# Patient Record
Sex: Female | Born: 1970 | Race: White | Hispanic: No | Marital: Single | Smoking: Never smoker
Health system: Southern US, Community
[De-identification: ages and names within clinical notes are randomized; demographics above are authoritative.]

## PROBLEM LIST (undated history)

## (undated) DIAGNOSIS — E119 Type 2 diabetes mellitus without complications: Secondary | ICD-10-CM

## (undated) DIAGNOSIS — C9100 Acute lymphoblastic leukemia not having achieved remission: Secondary | ICD-10-CM

## (undated) DIAGNOSIS — N289 Disorder of kidney and ureter, unspecified: Secondary | ICD-10-CM

## (undated) HISTORY — PX: OTHER SURGICAL HISTORY: SHX169

## (undated) HISTORY — PX: TONSILLECTOMY: SUR1361

---

## 2015-05-19 ENCOUNTER — Emergency Department (HOSPITAL_BASED_OUTPATIENT_CLINIC_OR_DEPARTMENT_OTHER): Payer: Managed Care, Other (non HMO)

## 2015-05-19 ENCOUNTER — Encounter (HOSPITAL_BASED_OUTPATIENT_CLINIC_OR_DEPARTMENT_OTHER): Payer: Self-pay

## 2015-05-19 ENCOUNTER — Emergency Department (HOSPITAL_BASED_OUTPATIENT_CLINIC_OR_DEPARTMENT_OTHER)
Admission: EM | Admit: 2015-05-19 | Discharge: 2015-05-20 | Disposition: A | Discharge status: Home / Self Care | Payer: Managed Care, Other (non HMO) | Attending: Emergency Medicine | Admitting: Emergency Medicine

## 2015-05-19 DIAGNOSIS — N390 Urinary tract infection, site not specified: Secondary | ICD-10-CM | POA: Diagnosis not present

## 2015-05-19 DIAGNOSIS — Z3202 Encounter for pregnancy test, result negative: Secondary | ICD-10-CM | POA: Insufficient documentation

## 2015-05-19 DIAGNOSIS — Z856 Personal history of leukemia: Secondary | ICD-10-CM | POA: Diagnosis not present

## 2015-05-19 DIAGNOSIS — Z794 Long term (current) use of insulin: Secondary | ICD-10-CM | POA: Diagnosis not present

## 2015-05-19 DIAGNOSIS — N23 Unspecified renal colic: Secondary | ICD-10-CM | POA: Diagnosis not present

## 2015-05-19 DIAGNOSIS — E119 Type 2 diabetes mellitus without complications: Secondary | ICD-10-CM | POA: Diagnosis not present

## 2015-05-19 DIAGNOSIS — R109 Unspecified abdominal pain: Secondary | ICD-10-CM | POA: Diagnosis present

## 2015-05-19 DIAGNOSIS — R10A2 Flank pain, left side: Secondary | ICD-10-CM

## 2015-05-19 HISTORY — DX: Type 2 diabetes mellitus without complications: E11.9

## 2015-05-19 HISTORY — DX: Acute lymphoblastic leukemia not having achieved remission: C91.00

## 2015-05-19 LAB — COMPREHENSIVE METABOLIC PANEL
ALBUMIN: 4 g/dL (ref 3.5–5.0)
ALT: 27 U/L (ref 14–54)
ANION GAP: 6 (ref 5–15)
AST: 35 U/L (ref 15–41)
Alkaline Phosphatase: 73 U/L (ref 38–126)
BUN: 25 mg/dL — ABNORMAL HIGH (ref 6–20)
CO2: 28 mmol/L (ref 22–32)
CREATININE: 1.48 mg/dL — AB (ref 0.44–1.00)
Calcium: 8.2 mg/dL — ABNORMAL LOW (ref 8.9–10.3)
Chloride: 100 mmol/L — ABNORMAL LOW (ref 101–111)
GFR calc Af Amer: 48 mL/min — ABNORMAL LOW (ref >60)
GFR, EST NON AFRICAN AMERICAN: 42 mL/min — AB (ref >60)
Glucose, Bld: 271 mg/dL — ABNORMAL HIGH (ref 65–99)
POTASSIUM: 4 mmol/L (ref 3.5–5.1)
SODIUM: 134 mmol/L — AB (ref 135–145)
TOTAL PROTEIN: 7 g/dL (ref 6.5–8.1)
Total Bilirubin: 0.4 mg/dL (ref 0.3–1.2)

## 2015-05-19 LAB — CBC WITH DIFFERENTIAL/PLATELET
BASOS PCT: 0 % (ref 0–1)
Basophils Absolute: 0 10*3/uL (ref 0.0–0.1)
EOS PCT: 1 % (ref 0–5)
Eosinophils Absolute: 0.1 10*3/uL (ref 0.0–0.7)
HCT: 40.1 % (ref 36.0–46.0)
Hemoglobin: 12.9 g/dL (ref 12.0–15.0)
Lymphocytes Relative: 33 % (ref 12–46)
Lymphs Abs: 3.4 10*3/uL (ref 0.7–4.0)
MCH: 28.5 pg (ref 26.0–34.0)
MCHC: 32.2 g/dL (ref 30.0–36.0)
MCV: 88.5 fL (ref 78.0–100.0)
Monocytes Absolute: 0.7 10*3/uL (ref 0.1–1.0)
Monocytes Relative: 6 % (ref 3–12)
NEUTROS PCT: 60 % (ref 43–77)
Neutro Abs: 6.1 10*3/uL (ref 1.7–7.7)
PLATELETS: 200 10*3/uL (ref 150–400)
RBC: 4.53 MIL/uL (ref 3.87–5.11)
RDW: 13.2 % (ref 11.5–15.5)
WBC: 10.4 10*3/uL (ref 4.0–10.5)

## 2015-05-19 LAB — URINE MICROSCOPIC-ADD ON

## 2015-05-19 LAB — URINALYSIS, ROUTINE W REFLEX MICROSCOPIC
BILIRUBIN URINE: NEGATIVE
Glucose, UA: 100 mg/dL — AB
KETONES UR: 15 mg/dL — AB
NITRITE: POSITIVE — AB
Protein, ur: NEGATIVE mg/dL
SPECIFIC GRAVITY, URINE: 1.021 (ref 1.005–1.030)
UROBILINOGEN UA: 1 mg/dL (ref 0.0–1.0)
pH: 5 (ref 5.0–8.0)

## 2015-05-19 LAB — PREGNANCY, URINE: Preg Test, Ur: NEGATIVE

## 2015-05-19 MED ORDER — ONDANSETRON HCL 4 MG/2ML IJ SOLN
4.0000 mg | Freq: Once | INTRAMUSCULAR | Status: AC
Start: 2015-05-19 — End: 2015-05-19
  Administered 2015-05-19: 4 mg via INTRAVENOUS
  Filled 2015-05-19: qty 2

## 2015-05-19 MED ORDER — SODIUM CHLORIDE 0.9 % IV SOLN
Freq: Once | INTRAVENOUS | Status: AC
  Administered 2015-05-19: 23:00:00 via INTRAVENOUS

## 2015-05-19 MED ORDER — FENTANYL CITRATE (PF) 100 MCG/2ML IJ SOLN
100.0000 ug | Freq: Once | INTRAMUSCULAR | Status: AC
  Administered 2015-05-19: 100 ug via INTRAVENOUS
  Filled 2015-05-19: qty 2

## 2015-05-19 MED ORDER — CEFTRIAXONE SODIUM 1 G IJ SOLR
INTRAMUSCULAR | Status: AC
  Filled 2015-05-19: qty 10

## 2015-05-19 MED ORDER — DEXTROSE 5 % IV SOLN
1.0000 g | Freq: Once | INTRAVENOUS | Status: AC
  Administered 2015-05-19: 1 g via INTRAVENOUS

## 2015-05-19 NOTE — ED Notes (Signed)
VSS, remains alert, NAD, calm, interactive, resps e/u, no longer shallow, O2 [place] 3L remains.

## 2015-05-19 NOTE — ED Notes (Signed)
After fentanyl pt became apneic and turned cyanotic, SPO2 down to 60s NRB placed at 15L, pt arousable to sternal rub and follows commands to deep breathe, SPO2 improved to 100%, skin returned to pink, A&Ox4, Dr. Florina Ou immediately into room, pt alert, NAD, "scared" interactive, follows commands.

## 2015-05-19 NOTE — ED Notes (Signed)
Pt reports left flank pain since 0200 - states seen at Urgent Care and given 2 prescriptions for UTI that are not effective.

## 2015-05-19 NOTE — ED Notes (Signed)
Pt alert, NAD, calm, interactive, "feel better", to xray on stretcher and O2.

## 2015-05-19 NOTE — ED Provider Notes (Addendum)
CSN: [id number]     Arrival date & time 05/19/15  2208 History  This chart was scribed for Shanon Rosser, MD by Chester Holstein, ED Scribe. This patient was seen in room MH12/MH12 and the patient's care was started at 11:04 PM.    Chief Complaint  Patient presents with  . Flank Pain   The history is provided by the patient. No language interpreter was used.     HPI HPI Comments: Vicki Francis is a 44 y.o. female who presents to the Emergency Department complaining of left flank pain with onset at 2 AM today. She states pain radiates to LLQ of abdomen. She describes the pain as severe. It is not significantly changed with movement. She reports associated single episode of vomiting and urinary frequency. Pt was seen at an Urgent Care earlier and dx with a UTI. Pt was started on ciprofloxacin and pyridium. Pt denies fever, chills and dysuria.  Past Medical History  Diagnosis Date  . Diabetes mellitus without complication   . ALL (acute lymphoblastic leukemia)    Past Surgical History  Procedure Laterality Date  . Tonsillectomy     History reviewed. No pertinent family history. History  Substance Use Topics  . Smoking status: Never Smoker   . Smokeless tobacco: Not on file  . Alcohol Use: No   OB History    No data available     Review of Systems A complete 10 system review of systems was obtained and all systems are negative except as noted in the HPI and PMH.      Allergies  Fentanyl; Aleve; and Codeine  Home Medications   Prior to Admission medications   Medication Sig Start Date End Date Taking? Authorizing Provider  Insulin Aspart (NOVOLOG [place]) Inject into the skin.   Yes Historical Provider, MD   BP 146/93 mmHg  Pulse 75  Temp(Src) 97.8 F (36.6 C) (Oral)  Resp 26  Ht 5\' 1"  (1.549 m)  Wt 244 lb (110.678 kg)  BMI 46.13 kg/m2  SpO2 96%   Physical Exam  General: Well-developed, well-nourished female in no acute distress; appearance consistent with age of  record HENT: normocephalic; atraumatic Eyes: pupils equal, round and reactive to light; extraocular muscles intact Neck: supple Heart: regular rate and rhythm; no murmurs, rubs or gallops Lungs: clear to auscultation bilaterally Abdomen: soft; nondistended; mild left suprapubic tenderness; no masses or hepatosplenomegaly; bowel sounds present GU: mild left CVA tenderness Extremities: No deformity; full range of motion; pulses normal Neurologic: Awake, alert and oriented; motor function intact in all extremities and symmetric; no facial droop Skin: Warm and dry Psychiatric: Normal mood and affect Nursing note and vitals reviewed.   ED Course  Procedures (including critical care time)   COORDINATION OF CARE: 11:08 PM Discussed treatment plan with patient at beside, the patient agrees with the plan and has no further questions at this time.     MDM   Nursing notes and vitals signs, including pulse oximetry, reviewed.  Summary of this visit's results, reviewed by myself:  Labs:  Results for orders placed or performed during the hospital encounter of 05/19/15 (from the past 24 hour(s))  Urinalysis, Routine w reflex microscopic (not at [facility])     Status: Abnormal   Collection Time: 05/19/15 10:17 PM  Result Value Ref Range   Color, Urine ORANGE (A) YELLOW   APPearance CLOUDY (A) CLEAR   Specific Gravity, Urine 1.021 1.005 - 1.030   pH 5.0 5.0 - 8.0   Glucose, UA  100 (A) NEGATIVE mg/dL   Hgb urine dipstick TRACE (A) NEGATIVE   Bilirubin Urine NEGATIVE NEGATIVE   Ketones, ur 15 (A) NEGATIVE mg/dL   Protein, ur NEGATIVE NEGATIVE mg/dL   Urobilinogen, UA 1.0 0.0 - 1.0 mg/dL   Nitrite POSITIVE (A) NEGATIVE   Leukocytes, UA LARGE (A) NEGATIVE  Urine microscopic-add on     Status: Abnormal   Collection Time: 05/19/15 10:17 PM  Result Value Ref Range   Squamous Epithelial / LPF FEW (A) RARE   WBC, UA 21-50 <3 WBC/hpf   RBC / HPF 0-2 <3 RBC/hpf   Bacteria, UA MANY (A) RARE   Pregnancy, urine     Status: None   Collection Time: 05/19/15 10:17 PM  Result Value Ref Range   Preg Test, Ur NEGATIVE NEGATIVE  CBC with Differential     Status: None   Collection Time: 05/19/15 10:42 PM  Result Value Ref Range   WBC 10.4 4.0 - 10.5 K/uL   RBC 4.53 3.87 - 5.11 MIL/uL   Hemoglobin 12.9 12.0 - 15.0 g/dL   HCT 40.1 36.0 - 46.0 %   MCV 88.5 78.0 - 100.0 fL   MCH 28.5 26.0 - 34.0 pg   MCHC 32.2 30.0 - 36.0 g/dL   RDW 13.2 11.5 - 15.5 %   Platelets 200 150 - 400 K/uL   Neutrophils Relative % 60 43 - 77 %   Neutro Abs 6.1 1.7 - 7.7 K/uL   Lymphocytes Relative 33 12 - 46 %   Lymphs Abs 3.4 0.7 - 4.0 K/uL   Monocytes Relative 6 3 - 12 %   Monocytes Absolute 0.7 0.1 - 1.0 K/uL   Eosinophils Relative 1 0 - 5 %   Eosinophils Absolute 0.1 0.0 - 0.7 K/uL   Basophils Relative 0 0 - 1 %   Basophils Absolute 0.0 0.0 - 0.1 K/uL  Comprehensive metabolic panel     Status: Abnormal   Collection Time: 05/19/15 10:45 PM  Result Value Ref Range   Sodium 134 (L) 135 - 145 mmol/L   Potassium 4.0 3.5 - 5.1 mmol/L   Chloride 100 (L) 101 - 111 mmol/L   CO2 28 22 - 32 mmol/L   Glucose, Bld 271 (H) 65 - 99 mg/dL   BUN 25 (H) 6 - 20 mg/dL   Creatinine, Ser 1.48 (H) 0.44 - 1.00 mg/dL   Calcium 8.2 (L) 8.9 - 10.3 mg/dL   Total Protein 7.0 6.5 - 8.1 g/dL   Albumin 4.0 3.5 - 5.0 g/dL   AST 35 15 - 41 U/L   ALT 27 14 - 54 U/L   Alkaline Phosphatase 73 38 - 126 U/L   Total Bilirubin 0.4 0.3 - 1.2 mg/dL   GFR calc non Af Amer 42 (L) >60 mL/min   GFR calc Af Amer 48 (L) >60 mL/min   Anion gap 6 5 - 15    Imaging Studies: Ct Renal Stone Study  05/20/2015   CLINICAL DATA:  Acute onset of left flank pain. Vomiting and increased urinary frequency. Initial encounter.  EXAM: CT ABDOMEN AND PELVIS WITHOUT CONTRAST  TECHNIQUE: Multidetector CT imaging of the abdomen and pelvis was performed following the standard protocol without IV contrast.  COMPARISON:  None.  FINDINGS: The visualized lung  bases are clear.  There is diffuse fatty infiltration within the liver. The spleen is unremarkable in appearance. The gallbladder is within normal limits. The pancreas and adrenal glands are unremarkable.  There is minimal left-sided hydronephrosis, with  mild left-sided perinephric stranding. An obstructing 6 x 4 mm stone is noted in the proximal left ureter, 2-3 cm below the left renal pelvis. Bilateral fetal lobulations are seen. A nonobstructing 4 mm stone is noted near the upper pole of the right kidney.  No free fluid is identified. The small bowel is unremarkable in appearance. The stomach is within normal limits. No acute vascular abnormalities are seen.  The appendix is normal in caliber, without evidence for appendicitis. The colon is unremarkable in appearance.  The bladder is mildly distended and grossly unremarkable. The uterus is within normal limits. The ovaries are relatively symmetric. No suspicious adnexal masses are seen. No inguinal lymphadenopathy is seen.  No acute osseous abnormalities are identified. 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  IMPRESSION: 1. Minimal left-sided hydronephrosis, with an obstructing 6 x 4 mm stone in the proximal left ureter, 2-3 cm below the left renal pelvis. 2. Nonobstructing 4 mm stone near the upper pole of the right kidney. 3. Diffuse fatty infiltration within the liver. 4. 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   Electronically Signed   By: Garald Balding M.D.   On: 05/20/2015 01:23    11:33 PM Patient became apneic after 100 micrograms of fentanyl was administered over 6 minutes. Oxygen saturations improved with exogenous oxygen. Patient now awake and able to converse. For list fentanyl as a high risk drug intolerance.  2:18 AM Pain improved with IV morphine which the patient is tolerated well. She states he is ready to go home at this time. Her care was discussed with Dr. Junious Silk of urology. He will see her in the office in follow-up. He  advises anti-biotic treatment in the meantime. Patient states she can tolerate oxycodone.  I personally performed the services described in this documentation, which was scribed in my presence. The recorded information has been reviewed and is accurate.    Shanon Rosser, MD 05/20/15 [place], MD 05/20/15 [phone]

## 2015-05-20 LAB — URINE CULTURE
COLONY COUNT: NO GROWTH
Culture: NO GROWTH

## 2015-05-20 MED ORDER — MORPHINE SULFATE 2 MG/ML IJ SOLN
2.0000 mg | Freq: Once | INTRAMUSCULAR | Status: AC
  Administered 2015-05-20: 2 mg via INTRAVENOUS
  Filled 2015-05-20: qty 1

## 2015-05-20 MED ORDER — TAMSULOSIN HCL 0.4 MG PO CAPS
0.4000 mg | ORAL_CAPSULE | Freq: Once | ORAL | Status: AC
  Administered 2015-05-20: 0.4 mg via ORAL
  Filled 2015-05-20: qty 1

## 2015-05-20 MED ORDER — ONDANSETRON HCL 4 MG/2ML IJ SOLN
INTRAMUSCULAR | Status: AC
  Administered 2015-05-20: 4 mg via INTRAVENOUS
  Filled 2015-05-20: qty 2

## 2015-05-20 MED ORDER — OXYCODONE-ACETAMINOPHEN 5-325 MG PO TABS: 1.0000 | ORAL_TABLET | Freq: Four times a day (QID) | ORAL | Status: AC | PRN

## 2015-05-20 MED ORDER — METOCLOPRAMIDE HCL 10 MG PO TABS
10.0000 mg | ORAL_TABLET | Freq: Four times a day (QID) | ORAL | Status: AC | PRN
Start: 2015-05-20 — End: ?

## 2015-05-20 MED ORDER — MORPHINE SULFATE 4 MG/ML IJ SOLN
4.0000 mg | Freq: Once | INTRAMUSCULAR | Status: AC
  Administered 2015-05-20: 4 mg via INTRAVENOUS
  Filled 2015-05-20: qty 1

## 2015-05-20 MED ORDER — CIPROFLOXACIN HCL 500 MG PO TABS: 500.0000 mg | ORAL_TABLET | Freq: Two times a day (BID) | ORAL | Status: AC

## 2015-05-20 MED ORDER — TAMSULOSIN HCL 0.4 MG PO CAPS: ORAL_CAPSULE | ORAL | Status: AC

## 2015-05-20 MED ORDER — ONDANSETRON HCL 4 MG/2ML IJ SOLN
4.0000 mg | Freq: Once | INTRAMUSCULAR | Status: AC
  Administered 2015-05-20: 4 mg via INTRAVENOUS

## 2015-05-20 NOTE — ED Notes (Signed)
"  feel better", given Rx x3, pain severe 8-9/10, "eased off", "ready to go home", nausea resolved, friend at [facility]. Denies questions or needs. Given referral.

## 2015-05-20 NOTE — ED Notes (Signed)
Dr. Florina Ou at [facility], family at [facility]. Pt resting, NAD, calm, interactive, no dyspnea noted, VSS.

## 2015-07-11 ENCOUNTER — Encounter (HOSPITAL_BASED_OUTPATIENT_CLINIC_OR_DEPARTMENT_OTHER): Payer: Self-pay | Admitting: *Deleted

## 2015-07-11 ENCOUNTER — Emergency Department (HOSPITAL_BASED_OUTPATIENT_CLINIC_OR_DEPARTMENT_OTHER)
Admission: EM | Admit: 2015-07-11 | Discharge: 2015-07-11 | Disposition: A | Discharge status: Home / Self Care | Payer: Managed Care, Other (non HMO) | Attending: Emergency Medicine | Admitting: Emergency Medicine

## 2015-07-11 ENCOUNTER — Emergency Department (HOSPITAL_BASED_OUTPATIENT_CLINIC_OR_DEPARTMENT_OTHER): Payer: Managed Care, Other (non HMO)

## 2015-07-11 DIAGNOSIS — R109 Unspecified abdominal pain: Secondary | ICD-10-CM | POA: Diagnosis present

## 2015-07-11 DIAGNOSIS — Z856 Personal history of leukemia: Secondary | ICD-10-CM | POA: Diagnosis not present

## 2015-07-11 DIAGNOSIS — E119 Type 2 diabetes mellitus without complications: Secondary | ICD-10-CM | POA: Diagnosis not present

## 2015-07-11 DIAGNOSIS — Z794 Long term (current) use of insulin: Secondary | ICD-10-CM | POA: Insufficient documentation

## 2015-07-11 DIAGNOSIS — K5000 Crohn's disease of small intestine without complications: Secondary | ICD-10-CM | POA: Diagnosis not present

## 2015-07-11 DIAGNOSIS — R319 Hematuria, unspecified: Secondary | ICD-10-CM

## 2015-07-11 DIAGNOSIS — N390 Urinary tract infection, site not specified: Secondary | ICD-10-CM | POA: Diagnosis not present

## 2015-07-11 DIAGNOSIS — Z3202 Encounter for pregnancy test, result negative: Secondary | ICD-10-CM | POA: Insufficient documentation

## 2015-07-11 HISTORY — DX: Disorder of kidney and ureter, unspecified: N28.9

## 2015-07-11 LAB — CBC
HEMATOCRIT: 42.3 % (ref 36.0–46.0)
Hemoglobin: 13.7 g/dL (ref 12.0–15.0)
MCH: 28.2 pg (ref 26.0–34.0)
MCHC: 32.4 g/dL (ref 30.0–36.0)
MCV: 87.2 fL (ref 78.0–100.0)
PLATELETS: 261 10*3/uL (ref 150–400)
RBC: 4.85 MIL/uL (ref 3.87–5.11)
RDW: 14.2 % (ref 11.5–15.5)
WBC: 9.2 10*3/uL (ref 4.0–10.5)

## 2015-07-11 LAB — URINALYSIS, ROUTINE W REFLEX MICROSCOPIC
Bilirubin Urine: NEGATIVE
Glucose, UA: 100 mg/dL — AB
Ketones, ur: NEGATIVE mg/dL
Nitrite: NEGATIVE
Protein, ur: NEGATIVE mg/dL
Specific Gravity, Urine: 1.014 (ref 1.005–1.030)
Urobilinogen, UA: 0.2 mg/dL (ref 0.0–1.0)
pH: 5.5 (ref 5.0–8.0)

## 2015-07-11 LAB — BASIC METABOLIC PANEL
ANION GAP: 11 (ref 5–15)
BUN: 22 mg/dL — ABNORMAL HIGH (ref 6–20)
CALCIUM: 9.9 mg/dL (ref 8.9–10.3)
CO2: 30 mmol/L (ref 22–32)
Chloride: 98 mmol/L — ABNORMAL LOW (ref 101–111)
Creatinine, Ser: 1.04 mg/dL — ABNORMAL HIGH (ref 0.44–1.00)
Glucose, Bld: 266 mg/dL — ABNORMAL HIGH (ref 65–99)
POTASSIUM: 4.3 mmol/L (ref 3.5–5.1)
Sodium: 139 mmol/L (ref 135–145)

## 2015-07-11 LAB — URINE MICROSCOPIC-ADD ON

## 2015-07-11 LAB — PREGNANCY, URINE: Preg Test, Ur: NEGATIVE

## 2015-07-11 MED ORDER — MORPHINE SULFATE 4 MG/ML IJ SOLN
4.0000 mg | Freq: Once | INTRAMUSCULAR | Status: AC
  Administered 2015-07-11: 4 mg via INTRAVENOUS
  Filled 2015-07-11: qty 1

## 2015-07-11 MED ORDER — CEPHALEXIN 500 MG PO CAPS: 500.0000 mg | ORAL_CAPSULE | Freq: Four times a day (QID) | ORAL | Status: AC

## 2015-07-11 MED ORDER — HYDROCODONE-ACETAMINOPHEN 5-325 MG PO TABS: 1.0000 | ORAL_TABLET | ORAL | Status: AC | PRN

## 2015-07-11 NOTE — ED Notes (Signed)
Patient transported to CT 

## 2015-07-11 NOTE — Discharge Instructions (Signed)
Take Vicodin for severe pain only. No driving or operating heavy machinery while taking vicodin. This medication may cause drowsiness. Take keflex as directed for 1 week. Return to the emergency department if you develop fever, diarrhea, or any worsening symptoms.  Urinary Tract Infection Urinary tract infections (UTIs) can develop anywhere along your urinary tract. Your urinary tract is your body's drainage system for removing wastes and extra water. Your urinary tract includes two kidneys, two ureters, a bladder, and a urethra. Your kidneys are a pair of bean-shaped organs. Each kidney is about the size of your fist. They are located below your ribs, one on each side of your spine. CAUSES Infections are caused by microbes, which are microscopic organisms, including fungi, viruses, and bacteria. These organisms are so small that they can only be seen through a microscope. Bacteria are the microbes that most commonly cause UTIs. SYMPTOMS  Symptoms of UTIs may vary by age and gender of the patient and by the location of the infection. Symptoms in young women typically include a frequent and intense urge to urinate and a painful, burning feeling in the bladder or urethra during urination. Older women and men are more likely to be tired, shaky, and weak and have muscle aches and abdominal pain. A fever may mean the infection is in your kidneys. Other symptoms of a kidney infection include pain in your back or sides below the ribs, nausea, and vomiting. DIAGNOSIS To diagnose a UTI, your caregiver will ask you about your symptoms. Your caregiver also will ask to provide a urine sample. The urine sample will be tested for bacteria and white blood cells. White blood cells are made by your body to help fight infection. TREATMENT  Typically, UTIs can be treated with medication. Because most UTIs are caused by a bacterial infection, they usually can be treated with the use of antibiotics. The choice of antibiotic  and length of treatment depend on your symptoms and the type of bacteria causing your infection. HOME CARE INSTRUCTIONS  If you were prescribed antibiotics, take them exactly as your caregiver instructs you. Finish the medication even if you feel better after you have only taken some of the medication.  Drink enough water and fluids to keep your urine clear or pale yellow.  Avoid caffeine, tea, and carbonated beverages. They tend to irritate your bladder.  Empty your bladder often. Avoid holding urine for long periods of time.  Empty your bladder before and after sexual intercourse.  After a bowel movement, women should cleanse from front to back. Use each tissue only once. SEEK MEDICAL CARE IF:   You have back pain.  You develop a fever.  Your symptoms do not begin to resolve within 3 days. SEEK IMMEDIATE MEDICAL CARE IF:   You have severe back pain or lower abdominal pain.  You develop chills.  You have nausea or vomiting.  You have continued burning or discomfort with urination. MAKE SURE YOU:   Understand these instructions.  Will watch your condition.  Will get help right away if you are not doing well or get worse. Document Released: 09/03/2005 Document Revised: 05/25/2012 Document Reviewed: 01/02/2012 [facility] Patient Information 2015 [place], [place]. This information is not intended to replace advice given to you by your health care provider. Make sure you discuss any questions you have with your health care provider. Flank Pain Flank pain refers to pain that is located on the side of the body between the upper abdomen and the back. The pain  may occur over a short period of time (acute) or may be long-term or reoccurring (chronic). It may be mild or severe. Flank pain can be caused by many things. CAUSES  Some of the more common causes of flank pain include:  Muscle strains.   Muscle spasms.   A disease of your spine (vertebral disk disease).   A lung  infection (pneumonia).   Fluid around your lungs (pulmonary edema).   A kidney infection.   Kidney stones.   A very painful skin rash caused by the chickenpox virus (shingles).   Gallbladder disease.  [place] care will depend on the cause of your pain. In general,  Rest as directed by your caregiver.  Drink enough fluids to keep your urine clear or pale yellow.  Only take over-the-counter or prescription medicines as directed by your caregiver. Some medicines may help relieve the pain.  Tell your caregiver about any changes in your pain.  Follow up with your caregiver as directed. SEEK IMMEDIATE MEDICAL CARE IF:   Your pain is not controlled with medicine.   You have new or worsening symptoms.  Your pain increases.   You have abdominal pain.   You have shortness of breath.   You have persistent nausea or vomiting.   You have swelling in your abdomen.   You feel faint or pass out.   You have blood in your urine.  You have a fever or persistent symptoms for more than 2-3 days.  You have a fever and your symptoms suddenly get worse. MAKE SURE YOU:   Understand these instructions.  Will watch your condition.  Will get help right away if you are not doing well or get worse. Document Released: 01/15/2006 Document Revised: 08/18/2012 Document Reviewed: 07/08/2012 [facility] Patient Information 2015 [place], [place]. This information is not intended to replace advice given to you by your health care provider. Make sure you discuss any questions you have with your health care provider.

## 2015-07-11 NOTE — ED Provider Notes (Signed)
CSN: [id number]     Arrival date & time 07/11/15  1849 History   First MD Initiated Contact with Patient 07/11/15 1859     Chief Complaint  Patient presents with  . Flank Pain     (Consider location/radiation/quality/duration/timing/severity/associated sxs/prior Treatment) HPI Comments: 44 year old morbidly obese female presenting with sudden onset left flank pain beginning around 4 PM today. Pain starts in her left lower back and radiates along her left flank, described as gnawing, constant, 8/10. States this feels very similar to when she had a kidney stone back in June that required stent placement after removal. She had the stent removed on July 5, followed by an infection of the stent site and was placed on antibiotics for 7 days. No problems since. Saw urology in [facility]. Denies any urinary symptoms. Denies increased urinary frequency, urgency, dysuria, hematuria, fever, chills, nausea or vomiting.  The history is provided by the patient.    Past Medical History  Diagnosis Date  . Diabetes mellitus without complication   . ALL (acute lymphoblastic leukemia)   . Renal disorder    Past Surgical History  Procedure Laterality Date  . Tonsillectomy    . Lithotripsy     No family history on file. History  Substance Use Topics  . Smoking status: Never Smoker   . Smokeless tobacco: Not on file  . Alcohol Use: No   OB History    No data available     Review of Systems  Genitourinary: Positive for flank pain.  Musculoskeletal: Positive for back pain.  All other systems reviewed and are negative.     Allergies  Fentanyl; Aleve; and Codeine  Home Medications   Prior to Admission medications   Medication Sig Start Date End Date Taking? Authorizing Provider  cephALEXin (KEFLEX) 500 MG capsule Take 1 capsule (500 mg total) by mouth 4 (four) times daily. 07/11/15   Carman Ching, PA-C  ciprofloxacin (CIPRO) 500 MG tablet Take 1 tablet (500 mg total) by mouth 2 (two) times  daily. One po bid x 7 days 05/20/15   Shanon Rosser, MD  HYDROcodone-acetaminophen (NORCO/VICODIN) 5-325 MG per tablet Take 1-2 tablets by mouth every 4 (four) hours as needed. 07/11/15   Carman Ching, PA-C  Insulin Aspart (NOVOLOG [place]) Inject into the skin.    Historical Provider, MD  metoCLOPramide (REGLAN) 10 MG tablet Take 1 tablet (10 mg total) by mouth every 6 (six) hours as needed for nausea or vomiting. 05/20/15   Shanon Rosser, MD  oxyCODONE-acetaminophen (PERCOCET) 5-325 MG per tablet Take 1-2 tablets by mouth every 6 (six) hours as needed (for pain). 05/20/15   John Molpus, MD  tamsulosin (FLOMAX) 0.4 MG CAPS capsule Take 1 capsule daily until stone passes. 05/20/15   John Molpus, MD   BP 147/102 mmHg  Pulse 86  Temp(Src) 98.3 F (36.8 C) (Oral)  Resp 18  Ht 5\' 1"  (1.549 m)  Wt 242 lb (109.77 kg)  BMI 45.75 kg/m2  SpO2 100% Physical Exam  Constitutional: She is oriented to person, place, and time. She appears well-developed and well-nourished. No distress.  Morbidly obese.  HENT:  Head: Normocephalic and atraumatic.  Mouth/Throat: Oropharynx is clear and moist.  Eyes: Conjunctivae and EOM are normal.  Neck: Normal range of motion. Neck supple.  Cardiovascular: Normal rate, regular rhythm and normal heart sounds.   Pulmonary/Chest: Effort normal and breath sounds normal. No respiratory distress.  Abdominal: There is no rigidity, no rebound and no guarding.  Exam limited by body habitus. No peritoneal signs.  Musculoskeletal: Normal range of motion. She exhibits no edema.       Back:  Neurological: She is alert and oriented to person, place, and time. No sensory deficit.  Skin: Skin is warm and dry.  Psychiatric: She has a normal mood and affect. Her behavior is normal.  Nursing note and vitals reviewed.   ED Course  Procedures (including critical care time) Labs Review Labs Reviewed  URINALYSIS, ROUTINE W REFLEX MICROSCOPIC (NOT AT [facility]) - Abnormal; Notable for the  following:    APPearance CLOUDY (*)    Glucose, UA 100 (*)    Hgb urine dipstick LARGE (*)    Leukocytes, UA MODERATE (*)    All other components within normal limits  URINE MICROSCOPIC-ADD ON - Abnormal; Notable for the following:    Bacteria, UA FEW (*)    All other components within normal limits  BASIC METABOLIC PANEL - Abnormal; Notable for the following:    Chloride 98 (*)    Glucose, Bld 266 (*)    BUN 22 (*)    Creatinine, Ser 1.04 (*)    All other components within normal limits  URINE CULTURE  CBC  PREGNANCY, URINE    Imaging Review Ct Renal Stone Study  07/11/2015   CLINICAL DATA:  Left flank pain beginning earlier tonight. History of kidney stones. History of leukemia at 44 years old.  EXAM: CT ABDOMEN AND PELVIS WITHOUT CONTRAST  TECHNIQUE: Multidetector CT imaging of the abdomen and pelvis was performed following the standard protocol without IV contrast.  COMPARISON:  05/20/2015  FINDINGS: Lung bases are within normal.  Abdominal images demonstrate diffuse hepatic steatosis without focal mass. The spleen, pancreas, gallbladder and adrenal glands are within normal. The appendix is normal.  Kidneys are normal in size with lobulated contour. There is a 2-3 mm stone over the upper pole collecting system of the right kidney. No left renal stones. No significant hydronephrosis. Ureters are normal.  There is possible mild wall thickening of a few jejunal loops over the left mid abdomen although difficult to accurately assess without oral contrast. No inflammation or fluid within the mesentery.  Vascular structures are within normal.  Pelvic images demonstrate the bladder, uterus and rectosigmoid colon to be within normal. No free fluid. Remaining bones and soft tissues are within normal.  IMPRESSION: 2-3 mm nonobstructing stone over the upper pole collecting system of the right kidney. No left renal stones and no ureteral stones or obstruction.  Suggestion of minimal wall thickening over  several jejunal loops in the left mid abdomen, although difficult to assess due to lack of oral contrast. Findings may be due to a regional enteritis of infectious or inflammatory nature.  Mild hepatic steatosis.   Electronically Signed   By: Marin Olp M.D.   On: 07/11/2015 20:30     EKG Interpretation None      MDM   Final diagnoses:  Left flank pain  Urinary tract infection with hematuria, site unspecified  Regional enteritis of jejunum, without complications   Non-toxic appearing, NAD. AFVSS. Abdomen soft with no peritoneal signs. Initial concern for recurrent stone given recent hx. UA sig for infection with hematuria. CT renal stone study obtained- results as stated above. Pt does not have any fever or diarrhea. Inconsistent with enteritis. Discussed this finding with pt, states her pain has significantly improved after morphine, abdomen/flank minimally tender on repeat exam. Advised her to return with developing fever, diarrhea or  worsening pain. Will treat UTI with keflex. She recently had cipro for a UTI. F/u with PCP within 1-2 days. Stable for d/c. Return precautions given. Patient states understanding of treatment care plan and is agreeable.  Carman Ching, PA-C 07/11/15 2055  Virgel Manifold, MD 07/11/15 2101

## 2015-07-11 NOTE — ED Notes (Signed)
Pt c/o left flank pain that began earlier tonight. Pt was seen here in June for kidney stone and sts the pain feels the same.

## 2015-07-13 LAB — URINE CULTURE

## 2021-10-18 ENCOUNTER — Emergency Department (HOSPITAL_COMMUNITY): Payer: Medicare Other

## 2021-10-18 ENCOUNTER — Emergency Department (HOSPITAL_COMMUNITY)
Admission: EM | Admit: 2021-10-18 | Discharge: 2021-10-18 | Disposition: A | Discharge status: Home / Self Care | Payer: Medicare Other | Attending: Emergency Medicine | Admitting: Emergency Medicine

## 2021-10-18 DIAGNOSIS — Z789 Other specified health status: Secondary | ICD-10-CM

## 2021-10-18 DIAGNOSIS — Z20822 Contact with and (suspected) exposure to covid-19: Secondary | ICD-10-CM | POA: Insufficient documentation

## 2021-10-18 DIAGNOSIS — E119 Type 2 diabetes mellitus without complications: Secondary | ICD-10-CM | POA: Insufficient documentation

## 2021-10-18 DIAGNOSIS — R569 Unspecified convulsions: Secondary | ICD-10-CM

## 2021-10-18 DIAGNOSIS — Z79899 Other long term (current) drug therapy: Secondary | ICD-10-CM | POA: Insufficient documentation

## 2021-10-18 DIAGNOSIS — R531 Weakness: Secondary | ICD-10-CM | POA: Diagnosis present

## 2021-10-18 DIAGNOSIS — T82594A Other mechanical complication of infusion catheter, initial encounter: Secondary | ICD-10-CM | POA: Diagnosis not present

## 2021-10-18 DIAGNOSIS — Z794 Long term (current) use of insulin: Secondary | ICD-10-CM | POA: Insufficient documentation

## 2021-10-18 DIAGNOSIS — R4701 Aphasia: Secondary | ICD-10-CM

## 2021-10-18 DIAGNOSIS — E11649 Type 2 diabetes mellitus with hypoglycemia without coma: Secondary | ICD-10-CM | POA: Diagnosis not present

## 2021-10-18 DIAGNOSIS — R471 Dysarthria and anarthria: Secondary | ICD-10-CM | POA: Insufficient documentation

## 2021-10-18 DIAGNOSIS — I639 Cerebral infarction, unspecified: Secondary | ICD-10-CM | POA: Insufficient documentation

## 2021-10-18 LAB — RESP PANEL BY RT-PCR (FLU A&B, COVID) ARPGX2
Influenza A by PCR: NEGATIVE
Influenza B by PCR: NEGATIVE
SARS Coronavirus 2 by RT PCR: NEGATIVE

## 2021-10-18 LAB — I-STAT CHEM 8, ED
BUN: 31 mg/dL — ABNORMAL HIGH (ref 6–20)
Calcium, Ion: 1.02 mmol/L — ABNORMAL LOW (ref 1.15–1.40)
Chloride: 108 mmol/L (ref 98–111)
Creatinine, Ser: 1 mg/dL (ref 0.44–1.00)
Glucose, Bld: 75 mg/dL (ref 70–99)
HCT: 45 % (ref 36.0–46.0)
Hemoglobin: 15.3 g/dL — ABNORMAL HIGH (ref 12.0–15.0)
Potassium: 4.5 mmol/L (ref 3.5–5.1)
Sodium: 140 mmol/L (ref 135–145)
TCO2: 24 mmol/L (ref 22–32)

## 2021-10-18 LAB — COMPREHENSIVE METABOLIC PANEL
ALT: 14 U/L (ref 0–44)
AST: 20 U/L (ref 15–41)
Albumin: 3.7 g/dL (ref 3.5–5.0)
Alkaline Phosphatase: 59 U/L (ref 38–126)
Anion gap: 12 (ref 5–15)
BUN: 23 mg/dL — ABNORMAL HIGH (ref 6–20)
CO2: 21 mmol/L — ABNORMAL LOW (ref 22–32)
Calcium: 8.9 mg/dL (ref 8.9–10.3)
Chloride: 106 mmol/L (ref 98–111)
Creatinine, Ser: 1.09 mg/dL — ABNORMAL HIGH (ref 0.44–1.00)
GFR, Estimated: >60 mL/min (ref >60)
Glucose, Bld: 69 mg/dL — ABNORMAL LOW (ref 70–99)
Potassium: 4.2 mmol/L (ref 3.5–5.1)
Sodium: 139 mmol/L (ref 135–145)
Total Bilirubin: 0.5 mg/dL (ref 0.3–1.2)
Total Protein: 7.5 g/dL (ref 6.5–8.1)

## 2021-10-18 LAB — URINALYSIS, ROUTINE W REFLEX MICROSCOPIC
Bilirubin Urine: NEGATIVE
Glucose, UA: NEGATIVE mg/dL
Hgb urine dipstick: NEGATIVE
Ketones, ur: NEGATIVE mg/dL
Nitrite: POSITIVE — AB
Protein, ur: NEGATIVE mg/dL
Specific Gravity, Urine: 1.031 — ABNORMAL HIGH (ref 1.005–1.030)
pH: 6 (ref 5.0–8.0)

## 2021-10-18 LAB — CBC
HCT: 49.3 % — ABNORMAL HIGH (ref 36.0–46.0)
Hemoglobin: 14.3 g/dL (ref 12.0–15.0)
MCH: 28 pg (ref 26.0–34.0)
MCHC: 29 g/dL — ABNORMAL LOW (ref 30.0–36.0)
MCV: 96.5 fL (ref 80.0–100.0)
Platelets: 270 10*3/uL (ref 150–400)
RBC: 5.11 MIL/uL (ref 3.87–5.11)
RDW: 14.6 % (ref 11.5–15.5)
WBC: 8.9 10*3/uL (ref 4.0–10.5)
nRBC: 0 % (ref 0.0–0.2)

## 2021-10-18 LAB — CBG MONITORING, ED
Glucose-Capillary: 79 mg/dL (ref 70–99)
Glucose-Capillary: 16 mg/dL — CL (ref 70–99)
Glucose-Capillary: 65 mg/dL — ABNORMAL LOW (ref 70–99)
Glucose-Capillary: 86 mg/dL (ref 70–99)

## 2021-10-18 LAB — ETHANOL: Alcohol, Ethyl (B): <10 mg/dL (ref <10)

## 2021-10-18 LAB — I-STAT BETA HCG BLOOD, ED (MC, WL, AP ONLY): I-stat hCG, quantitative: <5 m[IU]/mL (ref <5)

## 2021-10-18 LAB — RAPID URINE DRUG SCREEN, HOSP PERFORMED
Amphetamines: NOT DETECTED
Barbiturates: NOT DETECTED
Benzodiazepines: NOT DETECTED
Cocaine: NOT DETECTED
Opiates: NOT DETECTED
Tetrahydrocannabinol: NOT DETECTED

## 2021-10-18 LAB — DIFFERENTIAL
Abs Immature Granulocytes: 0.03 10*3/uL (ref 0.00–0.07)
Basophils Absolute: 0.1 10*3/uL (ref 0.0–0.1)
Basophils Relative: 1 %
Eosinophils Absolute: 0.1 10*3/uL (ref 0.0–0.5)
Eosinophils Relative: 2 %
Immature Granulocytes: 0 %
Lymphocytes Relative: 38 %
Lymphs Abs: 3.3 10*3/uL (ref 0.7–4.0)
Monocytes Absolute: 0.5 10*3/uL (ref 0.1–1.0)
Monocytes Relative: 6 %
Neutro Abs: 4.8 10*3/uL (ref 1.7–7.7)
Neutrophils Relative %: 53 %

## 2021-10-18 LAB — PROTIME-INR
INR: 0.9 (ref 0.8–1.2)
Prothrombin Time: 12.4 seconds (ref 11.4–15.2)

## 2021-10-18 LAB — APTT: aPTT: 34 seconds (ref 24–36)

## 2021-10-18 MED ORDER — IOHEXOL 350 MG/ML SOLN
75.0000 mL | Freq: Once | INTRAVENOUS | Status: AC | PRN
  Administered 2021-10-18: 75 mL via INTRAVENOUS

## 2021-10-18 MED ORDER — LEVETIRACETAM 500 MG PO TABS
500.0000 mg | ORAL_TABLET | Freq: Two times a day (BID) | ORAL | Status: DC
  Administered 2021-10-18: 500 mg via ORAL
  Filled 2021-10-18: qty 1

## 2021-10-18 MED ORDER — LEVETIRACETAM 500 MG PO TABS: 500.0000 mg | ORAL_TABLET | Freq: Two times a day (BID) | ORAL | 0 refills | Status: AC

## 2021-10-18 MED ORDER — GADOBUTROL 1 MMOL/ML IV SOLN
10.0000 mL | Freq: Once | INTRAVENOUS | Status: AC | PRN
  Administered 2021-10-18: 10 mL via INTRAVENOUS

## 2021-10-18 MED ORDER — DEXTROSE 50 % IV SOLN
INTRAVENOUS | Status: AC
  Filled 2021-10-18: qty 50

## 2021-10-18 NOTE — Consult Note (Signed)
Neurology consult   CC: code stroke.  History is obtained from: EMS, chart, and patient.   HPI: Ms. Vicki Francis is a 50 yo female with a PMHx of obesity, meningioma, Right ACA territory stroke in 2019 (on Plavix and ASA 81mg ), HTN, DM II, ALL, hypothyroidism, adjustment disorder, and HLD. Patient presents via EMS from parking lot at [place] after experiencing witnessed left facial droop, slurred speech, and left sided weakness. Slightly confused and does not remember event.   Per EMS, patient had gone through the drive through at [place] and pulled over to eat. At 1147, patient had above symptoms and friend called 911. CBG 74 and BP 150s en route.   On further history provided to the attending by power of attorney listed as emergency contact in the patient's chart, Vicki Francis, the patient's friend had called her first and let her know what was happening.  The patient has had a recent change in her diabetes medication with Ozempic being switched to some other medication the name of which she cannot recall right now.  In this setting her blood sugars have been better controlled with morning glucoses typically in the low 100s.  She told her friend that she felt like her blood sugar was getting low and started to eat and drink some sweet tea when she then began to have difficulty with her speech.  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  Plan has been for watchful waiting of this lesion.  Patient has sequelae of intermittent dizziness and mild left sided weakness from 2019 stroke. At present, patient has a Left knee problem and is slated to see orthopaedics. She states her knee is interfering with her gait. She is using a cane at home, lives alone, and is independent with all ADLs and still drives.   After brief exam on the ED bridge and for airway  clearance, patient was taken emergently to CT suite. CTH showed no acute finding.    CTA head and neck with known atherosclerosis and areas of narrowing, but no LVO.   Patient was taken back to ED room after imaging. Her NIH was down to 3 from 6 on arrival. Due to her rapidly improving symptoms and that patient states she is back to her baseline with weakness and gait, no TNK is given, but will monitor patient until OSW. No IR, due to no LVO/embolus.   ED course was additionally notable for hypoglycemic episode to a glucose of 16 which improved with administration of glucose and food  In review of chart, it appears she was treated for her 2019 stroke at [facility]. Last CT head showed atherosclerosis, small vessel disease, and white matter disease.   Her LDL was 56 and HbA1c 7.5% one month ago. Do not see an echo result in past. Patient did have a normal EEG in past.   LKW: 1147 hours TNK given?: No, rapidly resolving symptoms and back to baseline after scans.  IR Thrombectomy?: No, no LVO.  MRS: 1  NIHSS:  1a Level of Consciousness: 0 1b LOC Questions: 0 1c LOC Commands: 0 2 Best Gaze: 0 3 Visual: 0 4 Facial Palsy: 2 5a Motor Arm - left: 1 5b Motor Arm - Right: 0 6a Motor Leg - Left: 2 6b Motor Leg - Right: 0 7 Limb Ataxia: 1 8 Sensory: 0 9 Best Language: 0 10 Dysarthria:  1 11 Extinction and Inattention: 0 TOTAL:  7  ROS: A robust ROS was unable to be performed due to emergent nature of event. -- Later confirmed with POA no acute complaints  Past Medical History:  Diagnosis Date   ALL (acute lymphoblastic leukemia) ([place])    Diabetes mellitus without complication ([place])    Renal disorder   Adjustment disorder HTN HLD Obesity Hypothyroidism R ACA stroke 2019  No family history on file.  Social History:  reports that she has never smoked. She does not have any smokeless tobacco history on file. She reports that she does not drink alcohol. No history on file for drug  use.   Prior to Admission medications   Medication Sig Start Date End Date Taking? Authorizing Provider  cephALEXin (KEFLEX) 500 MG capsule Take 1 capsule (500 mg total) by mouth 4 (four) times daily. 07/11/15   Hess, Hessie Diener, PA-C  ciprofloxacin (CIPRO) 500 MG tablet Take 1 tablet (500 mg total) by mouth 2 (two) times daily. One po bid x 7 days 05/20/15   Molpus, John, MD  HYDROcodone-acetaminophen (NORCO/VICODIN) 5-325 MG per tablet Take 1-2 tablets by mouth every 4 (four) hours as needed. 07/11/15   Hess, Hessie Diener, PA-C  Insulin Aspart (NOVOLOG [place]) Inject into the skin.    [provider]  metoCLOPramide (REGLAN) 10 MG tablet Take 1 tablet (10 mg total) by mouth every 6 (six) hours as needed for nausea or vomiting. 05/20/15   Molpus, Jenny Reichmann, MD  oxyCODONE-acetaminophen (PERCOCET) 5-325 MG per tablet Take 1-2 tablets by mouth every 6 (six) hours as needed (for pain). 05/20/15   Molpus, John, MD  tamsulosin (FLOMAX) 0.4 MG CAPS capsule Take 1 capsule daily until stone passes. 05/20/15   Molpus, Jenny Reichmann, MD   Exam: Current vital signs: 119/76. HR 70s. RR 16.  Wt 111.6 kg   BMI 46.49 kg/m   Physical Exam  Constitutional: Appears well-developed and well-nourished. Morbidly obese. NAD.  Psych: Affect appropriate to situation. Eyes: No scleral injection. HENT: No OP obstruction. Head: Normocephalic, atraumatic.  Cardiovascular: Normal rate and regular rhythm.  Respiratory: Effort normal.  GI: Abdomen soft.  Obese. No distension. There is no tenderness.  Skin: WDI.  Neuro: Mental Status: Patient is awake, alert, oriented to person, place, month, year, and situation. Patient is unable to give a clear and coherent history. No signs of neglect. Speech/Language:  Speech is clear, fluent with some dysarthria which cleared after scans. No aphasia. Repetition, naming, and comprehension intact.  Cranial Nerves: II: Visual Fields are full. Pupils are equal, round, and reactive to light.  III,IV,  VI: EOMI without ptosis or diploplia.  V: Facial sensation is symmetric to light touch in V1, V2, and V3. VII: Face with left facial droop improved after scans.  VIII: hearing is intact to voice. X: Uvula elevates symmetrically. XI: Shoulder shrug is symmetric. XII: tongue is midline without atrophy or fasciculations.  Motor: RUE: grips  5/5     biceps  5/5     triceps  5/5 LUE: grips  4/5    biceps  4/5     triceps  4/5 RLE: knee  5/5     thigh  5 /5     plantar flexion   5/5    dorsiflexion  5/5 LLE: Unable to lift and hold LLE  Sensory: Sensation is symmetric to light touch in all fours extremities. Extinction absent to DSS.  Plantars: Toes are downgoing bilaterally.  Cerebellar: Ataxia noted with FNF on  left and unable to perform to LEs.  Gait: Post scan, patient ambulated with assistance for safety. Began with short stride and with a few steps, stride widened. Limping a little on LLE. She states she is at her baseline at home.  No dizziness.   I have reviewed labs in epic and the pertinent results are: creatinine 1.0.  Glucose 74, 75.   MD reviewed the images obtained:  CTH:  -No acute intracranial process. -ASPECTS is 10 -Redemonstrated right petrous ridge meningioma, likely unchanged compared to 2021.   MRI brain with and without pending.  1. No acute intracranial process. 2. Slight interval growth of the left greater sphenoid wing meningioma, which abuts the left optic canal. 3. Overall unchanged size of a right petrous ridge none GE Oma and right frontal convexity meningioma .Marland KitchenMarland KitchenRedemonstrated dural-based enhancing masses compatible with meningiomas. A right petrous ridge meningioma measures up to are 1.3 x 0.9 cm in the axial plane (series 10, image 12), unchanged. Left greater sphenoid wing meningioma measures up to 2.1 x 1.6 cm in the axial plane (series 10, image 21), previously 1.9 x 1.6 cm; this lesion abuts the left optic canal. Right frontal  convexity meningioma measures up to 1.7 x 0.8 cm in the coronal plane (series 11, image 17), previously 1.8 x 0.8 cm when remeasured similarly. No increased T2 signal is seen in the adjacent parenchyma to suggest mass effect or edema.... COMPARISON:  05/02/2020  CTA head and neck  -Redemonstrated short segment severe stenosis in the right A2 segment. -Redemonstrated multifocal bilateral M2, distal MCA, P2, and distal PCA narrowing. -No hemodynamically significant stenosis in the neck. -Right posterior frontal convexity and right petrous ridge meningiomas are stable to slightly increased in size compared to  2019.   EEG  Negative for seizure or epileptiform discharges.   Assessment: 50 yo female presenting as a code stroke. Negative for acute imaging. Atherosclerosis on intracranial imaging is known. She should remain on Plavix and ASA and f/up with neurology at [facility] (do not see history of CAD or stenting, so likely on chronically due to atherosclerosis and multiple stroke risk factors, including: prior CVA, HLD, HTN, DM II, and small vessel disease on CT.   No TNK due to rapidly improving symptoms and patient stated she was back to baseline. No LVO on CTA H/N. Her exam was improved after scans and gait at baseline without dizziness. Given her slight confusion and amnesia of details of event, seizure is on the differential. EEG negative.   Impression:  -Code stroke, but doubt new stroke.  -confusion with amnesia of event, ? Seizure.  -Multiple stroke risk factors, already on Plavix, ASA, and statin.  -R ACA stroke 2019.  -Meningioma, stable.   Plan: -MRI brain with and without contrast due to history of meningioma  -EEG -Continue NIH and neuro checks until outside window for TNK.   Should MRI brain be positive for stroke, will have medicine admit for stroke workup.  Should EEG be positive for epileptiform discharges or seizure, we will recommend initiation of antiseizure  medication If these are negative, she can likely go home. Plan was communicated to ED MD.   If stroke +,  - Recommend TTE. - no need to recheck lipids as her LDL was 56 in October.  -No need to recheck A1c as done in October. (7.5%). However, goal is less than 7. F/up with PCP.  - Continue Aspirin 81mg  daily. - Continue Clopidogrel 75mg  daily. - SBP goal - Permissive hypertension  first 24 h < 220/110. Hold home medications for now. - Telemetry monitoring for arrhythmia. - bedside Swallow screen. - Stroke education. - PT/OT/SLP consult. - NIHSS as per protocol. - frequent neuro checks.  -stroke team to follow.   Patient seen by Clance Boll, MSN, APN-BC, nurse practitioner and by MD. Note/plan to be edited by MD as needed.  Pager: [phone]  Attending Neurologist's note:  Given additional history provided by power of attorney as well as hypoglycemia and MRI findings of enlarging meningioma in the left temporal region, I am highly concerned about the possibility of focal seizures.  Hypoglycemia would lower her seizure threshold, and intermittent focal seizures could explain her worsening memory recently.  Power of attorney has also noted some irritability recently and we discussed that initiation of Keppra could worsen this given the side effects of the medication, or may improve her memory and irritability if these are related to intermittent seizures.  TIA is also a possibility but she is fairly well optimized from a stroke risk perspective and in the absence of cardiac symptoms I do not think a repeat echo would be high yield.  Alternatively this may all be related to hypoglycemia alone, which was discussed with family. Given her overall clinical picture and recent history I do think a trial of antiseizure medications would be useful, with close outpatient neurology follow-up to determine whether the medication should be continued or not.   Discussed with ED provider initiation of  Keppra 500 mg twice daily.  From a neurological perspective patient is appropriate for discharge as she is back at her baseline.  Discussed with family close follow-up with outpatient neurologist (recommend 4 weeks).  Given no definitive seizure activity I do not think that this event needs reporting requirements to the DMV, however did urge caution until we are sure that the patient's blood glucose issues and confusional event are not recurrent  I personally saw this patient, gathering history, performing a full neurologic examination, reviewing relevant labs, personally reviewing relevant imaging including Head CT, CTA and MRI brain, and formulated the assessment and plan, adding the note above for completeness and clarity to accurately reflect my thoughts  Lesleigh Noe MD-PhD [facility] [phone] Available 7 AM to 7 PM, outside these hours please contact Neurologist on call listed on AMION

## 2021-10-18 NOTE — ED Notes (Signed)
Pt called out stating she thought her BG was getting low.  Pt was awake alert and oriented vital WNL with exception of CBG  a repeat completed on other finger right after drinking OJ cbg was better but still a little better MD at bedside

## 2021-10-18 NOTE — ED Notes (Signed)
CBG stable and MRI notified okay to take pt

## 2021-10-18 NOTE — ED Notes (Signed)
Pt ambulated with assistance to bathroom for urine sample

## 2021-10-18 NOTE — ED Notes (Signed)
E-signature pad unavailable at time of pt discharge. This RN discussed discharge materials with pt and answered all pt questions. Pt stated understanding of discharge material. ? ?

## 2021-10-18 NOTE — ED Provider Notes (Signed)
  Physical Exam  BP (!) 132/104   Pulse 71   Temp 98 F (36.7 C) (Oral)   Resp 15   Wt 111.6 kg   SpO2 100%   BMI 46.49 kg/m   Physical Exam  ED Course/Procedures     Procedures  MDM  Care of patient assumed from previous provider at shift change. Please refer to their note for further history and plan.  In brief, patient is a 50 y.o. y/o female presenting with: - left sided weakness - PMHx: Past Medical History:  Diagnosis Date   ALL (acute lymphoblastic leukemia) ([place])    Diabetes mellitus without complication ([place])    Renal disorder     Review of ED course: - negative CT - MRI in process - symptoms self-resolved while in ED   Plan at the time of handoff is as follows: - f/u pending MRI, if positive, admit to medicine - if negative, ok per neurology for d/c home   Additional MDM:  VS upon my assumption of care were sig for bradycardia.  Imaging: MRI negative for acute intracranial process, noted interval growth of known prior meningioma. Imaging reviewed by radiology and personally by me.  Medications: Medications  levETIRAcetam (KEPPRA) tablet 500 mg (500 mg Oral Given 10/18/21 2148)  iohexol (OMNIPAQUE) 350 MG/ML injection 75 mL (75 mLs Intravenous Contrast Given 10/18/21 1334)  dextrose 50 % solution (  Given 10/18/21 1722)  gadobutrol (GADAVIST) 1 MMOL/ML injection 10 mL (10 mLs Intravenous Contrast Given 10/18/21 1933)   Reengaged neurology, who advised close outpatient follow-up and discharged home with Keppra prescription, suspect that these episodes may represent new onset seizures in the setting of enlarging meningioma.  Patient called out to nursing complaining of concern for hypoglycemia, noted to have CBG of 16.  Patient given D50 bolus and p.o. orange juice.  Recheck 65 after few minutes, 86 after 1 hour of observation.  Patient re-evaluated to discharge. Hemodynamically stable and in no acute distress.  Neurologic baseline, no longer feels  symptomatic.  Patient denies dysuria or GU symptoms, reviewed UA ordered by prior provider notable for nitrite and bacteriuria.  Decision made to not treat this finding, as patient is not symptomatic and is able to verbalize as such.  Has no systemic infectious signs or symptoms.  Will follow urine cultures.  Discharged home in stable condition.  Will follow urine cultures for possible positive finding requiring treatment.  Rx Keppra.  Outpatient follow-up with neurology advised in the next 4 weeks as per neurology recommendations.  Advised close following up BGL and frequent meals.  Strict ED return precautions advised and discussed at length. Supportive care discussed. Advised close PCP follow up. Patient understands and agrees with the plan.  The plan for this patient was discussed with my attending physician, who voiced agreement and who oversaw evaluation and treatment of this patient.     Note: Estate manager/land agent was used in the creation of this note.   1. Left-sided weakness   2. Difficult intravenous access       Cherly Hensen, DO 10/19/21 0121    Tegeler, Gwenyth Allegra, MD 10/19/21 [phone]

## 2021-10-18 NOTE — Procedures (Signed)
Patient Name: Mccartney Brucks  MRN: [id number]  Epilepsy Attending: Lora Havens  Referring Physician/Provider: Clance Boll, NP Date: 10/18/2021 Duration: 23.54 minutes  Patient history: 50 year old female presented with left facial droop, slurred speech and left-sided weakness.  EEG to evaluate for seizure.  Level of alertness: Awake  AEDs during EEG study: None  Technical aspects: This EEG study was done with scalp electrodes positioned according to the 10-20 International system of electrode placement. Electrical activity was acquired at a sampling rate of 500Hz  and reviewed with a high frequency filter of 70Hz  and a low frequency filter of 1Hz . EEG data were recorded continuously and digitally stored.   Description: The posterior dominant rhythm consists of 8 Hz activity of moderate voltage (25-35 uV) seen predominantly in posterior head regions, symmetric and reactive to eye opening and eye closing. Hyperventilation and photic stimulation were not performed.     IMPRESSION: This study is within normal limits. No seizures or epileptiform discharges were seen throughout the recording.  Saul Dorsi Barbra Sarks

## 2021-10-18 NOTE — ED Provider Notes (Signed)
[facility] EMERGENCY DEPARTMENT Provider Note   CSN: [id number] Arrival date & time: 10/18/21  1249     History No chief complaint on file.   Vicki Francis is a 50 y.o. female.  Vicki Francis is a 50 y.o. female with a history of ALL, diabetes and kidney stones, who presents to the emergency department via EMS as a code stroke.  Per EMS patient reports around 11:47 AM she was at [employer] trying to order when she started having difficulty speaking and the words were not coming out correctly.  At this time she also noted to have left-sided weakness, but denied feeling weakness herself.  Denied associated numbness or visual changes.  No numbness or weakness on the right side.  No associated headache.  No witnessed seizure-like activity.  Patient is on Plavix per EMS, no other anticoagulation.  The history is provided by the patient, the EMS personnel and medical records.      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   Past Surgical History:  Procedure Laterality Date   lithotripsy     TONSILLECTOMY       OB History   No obstetric history on file.     No family history on file.  Social History   Tobacco Use   Smoking status: Never  Substance Use Topics   Alcohol use: No    Home Medications Prior to Admission medications   Medication Sig Start Date End Date Taking? Authorizing Provider  cephALEXin (KEFLEX) 500 MG capsule Take 1 capsule (500 mg total) by mouth 4 (four) times daily. 07/11/15   Hess, Hessie Diener, PA-C  ciprofloxacin (CIPRO) 500 MG tablet Take 1 tablet (500 mg total) by mouth 2 (two) times daily. One po bid x 7 days 05/20/15   Molpus, John, MD  HYDROcodone-acetaminophen (NORCO/VICODIN) 5-325 MG per tablet Take 1-2 tablets by mouth every 4 (four) hours as needed. 07/11/15   Hess,  Hessie Diener, PA-C  Insulin Aspart (NOVOLOG [place]) Inject into the skin.    [provider]  metoCLOPramide (REGLAN) 10 MG tablet Take 1 tablet (10 mg total) by mouth every 6 (six) hours as needed for nausea or vomiting. 05/20/15   Molpus, Jenny Reichmann, MD  oxyCODONE-acetaminophen (PERCOCET) 5-325 MG per tablet Take 1-2 tablets by mouth every 6 (six) hours as needed (for pain). 05/20/15   Molpus, John, MD  tamsulosin (FLOMAX) 0.4 MG CAPS capsule Take 1 capsule daily until stone passes. 05/20/15   Molpus, John, MD    Allergies    Fentanyl, Aleve [naproxen sodium], and Codeine  Review of Systems   Review of Systems  Constitutional:  Negative for chills and fever.  HENT: Negative.    Eyes:  Negative for visual disturbance.  Respiratory:  Negative for cough and shortness of breath.   Cardiovascular:  Negative for chest pain.  Gastrointestinal:  Negative for abdominal pain, nausea and vomiting.  Genitourinary:  Negative for dysuria.  Musculoskeletal:  Negative for myalgias.  Skin:  Negative for color change and rash.  Neurological:  Positive for speech difficulty and weakness. Negative for dizziness, seizures, syncope, facial asymmetry, light-headedness, numbness and headaches.  All other systems reviewed and are negative.  Physical Exam Updated Vital Signs BP 118/81   Pulse (!) 53   Resp 19   Wt 111.6 kg  SpO2 (!) 86%   BMI 46.49 kg/m   Physical Exam Vitals and nursing note reviewed.  Constitutional:      General: She is not in acute distress.    Appearance: Normal appearance. She is well-developed. She is not ill-appearing or diaphoretic.  HENT:     Head: Normocephalic and atraumatic.     Mouth/Throat:     Mouth: Mucous membranes are moist.     Pharynx: Oropharynx is clear.  Eyes:     General:        Right eye: No discharge.        Left eye: No discharge.  Cardiovascular:     Rate and Rhythm: Normal rate and regular rhythm.     Pulses: Normal pulses.     Heart sounds: Normal  heart sounds. No murmur heard.   No friction rub. No gallop.  Pulmonary:     Effort: Pulmonary effort is normal. No respiratory distress.     Breath sounds: Normal breath sounds. No wheezing or rales.     Comments: Respirations equal and unlabored, patient able to speak in full sentences, lungs clear to auscultation bilaterally  Abdominal:     General: Bowel sounds are normal. There is no distension.     Palpations: Abdomen is soft. There is no mass.     Tenderness: There is no abdominal tenderness. There is no guarding.     Comments: Abdomen soft, nondistended, nontender to palpation in all quadrants without guarding or peritoneal signs  Musculoskeletal:        General: No deformity.     Cervical back: Neck supple.  Skin:    General: Skin is warm and dry.     Capillary Refill: Capillary refill takes less than 2 seconds.  Neurological:     Mental Status: She is alert and oriented to person, place, and time.     Cranial Nerves: Facial asymmetry present.     Motor: Weakness present.     Comments: Speech is clear, able to follow commands, but sometimes requires repeated instructions left-sided facial droop noted, but all other cranial nerves grossly intact CN III-XII intact Weakness noted in the left upper and lower extremities, able to heel raise against gravity but both extremities quickly drift down, 5/5 strength in right upper and lower extremities Sensation normal to light and sharp touch  Psychiatric:        Mood and Affect: Mood normal.        Behavior: Behavior normal.    ED Results / Procedures / Treatments   Labs (all labs ordered are listed, but only abnormal results are displayed) Labs Reviewed  CBC - Abnormal; Notable for the following components:      Result Value   HCT 49.3 (*)    MCHC 29.0 (*)    All other components within normal limits  I-STAT CHEM 8, ED - Abnormal; Notable for the following components:   BUN 31 (*)    Calcium, Ion 1.02 (*)    Hemoglobin 15.3  (*)    All other components within normal limits  RESP PANEL BY RT-PCR (FLU A&B, COVID) ARPGX2  DIFFERENTIAL  ETHANOL  COMPREHENSIVE METABOLIC PANEL  RAPID URINE DRUG SCREEN, HOSP PERFORMED  URINALYSIS, ROUTINE W REFLEX MICROSCOPIC  PROTIME-INR  APTT  I-STAT BETA HCG BLOOD, ED ([facility], [facility], [facility] ONLY)  CBG MONITORING, ED    EKG None  Radiology CT HEAD CODE STROKE WO CONTRAST  Result Date: 10/18/2021 CLINICAL DATA:  Code stroke. EXAM: CT HEAD WITHOUT  CONTRAST TECHNIQUE: Contiguous axial images were obtained from the base of the skull through the vertex without intravenous contrast. COMPARISON:  07/13/2020 CT and MRI 05/03/2019. FINDINGS: Brain: No evidence of acute infarction, hemorrhage, cerebral edema, mass, mass effect, or midline shift. Ventricles and sulci are normal for age. No extra-axial fluid collection. Remote lacunar infarct in the left basal ganglia. Periventricular white matter changes, likely the sequela of chronic small vessel ischemic disease. 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 Vascular: No hyperdense vessel or unexpected calcification. Skull: Normal. Negative for fracture or focal lesion. Sinuses/Orbits: No acute finding. Other: The mastoid air cells are well aerated. ASPECTS [facility] Stroke Program Early CT Score) - Ganglionic level infarction (caudate, lentiform nuclei, internal capsule, insula, M1-M3 cortex): 7 - Supraganglionic infarction (M4-M6 cortex): 3 Total score (0-10 with 10 being normal): 10 IMPRESSION: 1. No acute intracranial process. 2. ASPECTS is 10 3. Redemonstrated right petrous ridge meningioma, likely unchanged compared to 2021. Code stroke imaging results were communicated on 10/18/2021 at 1:18 pm to provider Bhagat via secure text paging. Electronically Signed   By: Merilyn Baba M.D.   On: 10/18/2021 13:19   CT US GUIDE VASC ACCESS RT NO REPORT  Result Date: 10/18/2021 There is no  Radiologist interpretation  for this exam.   Procedures Procedures   Medications Ordered in ED Medications  iohexol (OMNIPAQUE) 350 MG/ML injection 75 mL (75 mLs Intravenous Contrast Given 10/18/21 1334)    ED Course  I have reviewed the triage vital signs and the nursing notes.  Pertinent labs & imaging results that were available during my care of the patient were reviewed by me and considered in my medical decision making (see chart for details).    MDM Rules/Calculators/A&P                           50 year old female arrives as a code stroke after she developed sudden onset of dysarthria and left-sided weakness that started at 11:47 AM while she was in line at [employer].  On initial exam on arrival with neurology patient has improved speech but continues to have left-sided weakness in the upper and lower extremity and left-sided facial droop.  Patient went to CT with neurology for code stroke evaluation, did not have any acute abnormalities on Noncon head CT or CTA of the head and neck, and while in CT patient symptoms seem to resolve.  She does have prior history per family of a stroke a few years ago with similar deficits.  CT of the head with no acute abnormalities and CTA with no evidence of LVO.  No other complaints today and on repeat examination after patient returned from CT all deficits have resolved.  Suspect patient may have had recrudescence of prior stroke.  Per neurology recommendations patient had MRI, if this is negative she can likely be discharged home but if she does have signs of new acute stroke on MRI will need admission and neurology will continue to follow.  At shift change care signed out to resident physician Cherly Hensen, who will follow up on MRI results  Final Clinical Impression(s) / ED Diagnoses Final diagnoses:  Difficult intravenous access  Left-sided weakness    Rx / DC Orders ED Discharge Orders     None        Janet Berlin 10/18/21 1534    Luna Fuse, MD 10/20/21 1315

## 2021-10-18 NOTE — Progress Notes (Signed)
EEG complete - results pending 

## 2021-10-18 NOTE — Code Documentation (Addendum)
Stroke Response Nurse Documentation Code Documentation  Vicki Francis is a 50 y.o. female arriving to [facility] ED via [facility] on 10/18/21 with past medical hx of CVA with deficits including left sided weakness, gait instability, slurred speech; DM, ALL, renal disorder. On clopidogrel 75 mg daily. Code stroke was activated by [facility]. BP 150/90, CBG 78.   Patient was coming through the [place] drive thru with a friend when they noticed a sudden change in her speech and some confusion at 70. They pulled over and called 911. EMS noted left facial droop, slurred speech and left sided weakness.   Stroke team at the bedside on patient arrival. Labs drawn and patient cleared for CT by Dr. Almyra Free. Patient to CT with team. NIHSS 7, see documentation for details and code stroke times. Patient with left facial droop, left arm weakness, left leg weakness, left limb ataxia, and dysarthria  on exam. The following imaging was completed: CT, CTA head and neck. Patient is not a candidate for IV Thrombolytic due to symptoms rapidly improving. Patient is not a candidate for IR due to no LVO.   Care/Plan: q30 vitals/mNIHSS until outside the window for TNK at 16:30, EEG, stroke workup.   Bedside handoff with ED RN [place].    Asani Deniston, Rande Brunt  Stroke Response RN

## 2021-10-18 NOTE — Discharge Instructions (Addendum)
Dear Vicki Francis Alert,  Thank you for allowing [place] to take care of you today.  We hope you begin feeling better soon.  - Please follow-up with your primary care physician or schedule an appointment to establish a primary care doctor if you do not have one already. - Please return to the Emergency Department or call 911 for chest pain, shortness of breath, severe pain, altered mental status, or if you have any reason to think you may need emergency medical care. -Remember to eat frequent meals, and keep blood sugar at a safe level with high protein foods -Check your blood sugar frequently -Start Keppra as directed twice a day -Call your neurologist for follow-up in the next 4 weeks   Sincerely,  Vicki Hensen, DO Department of Emergency Medicine [place]   Left-sided weakness  Difficult intravenous access - Plan: CT US GUIDE VASC ACCESS RT NO REPORT, CT US GUIDE VASC ACCESS RT NO REPORT

## 2021-10-21 LAB — URINE CULTURE: Culture: >=100000 — AB

## 2021-10-22 ENCOUNTER — Telehealth: Payer: Self-pay | Admitting: Emergency Medicine

## 2021-10-22 NOTE — Progress Notes (Signed)
ED Antimicrobial Stewardship Positive Culture Follow Up   Vicki Francis is an 50 y.o. female who presented to [facility] on 10/18/2021 with a chief complaint of code stroke.    Recent Results (from the past 720 hour(s))  Resp Panel by RT-PCR (Flu A&B, Covid) Nasopharyngeal Swab     Status: None   Collection Time: 10/18/21  3:53 PM   Specimen: Nasopharyngeal Swab; Nasopharyngeal(NP) swabs in vial transport medium  Result Value Ref Range Status   SARS Coronavirus 2 by RT PCR NEGATIVE NEGATIVE Final    Comment: (NOTE) SARS-CoV-2 target nucleic acids are NOT DETECTED.  The SARS-CoV-2 RNA is generally detectable in upper respiratory specimens during the acute phase of infection. The lowest concentration of SARS-CoV-2 viral copies this assay can detect is 138 copies/mL. A negative result does not preclude SARS-Cov-2 infection and should not be used as the sole basis for treatment or other patient management decisions. A negative result may occur with  improper specimen collection/handling, submission of specimen other than nasopharyngeal swab, presence of viral mutation(s) within the areas targeted by this assay, and inadequate number of viral copies(<138 copies/mL). A negative result must be combined with clinical observations, patient history, and epidemiological information. The expected result is Negative.  Fact Sheet for Patients:  EntrepreneurPulse.com.au  Fact Sheet for Healthcare Providers:  IncredibleEmployment.be  This test is no t yet approved or cleared by the [place] FDA and  has been authorized for detection and/or diagnosis of SARS-CoV-2 by FDA under an Emergency Use Authorization (EUA). This EUA will remain  in effect (meaning this test can be used) for the duration of the COVID-19 declaration under Section 564(b)(1) of the Act, 21 U.S.C.section 360bbb-3(b)(1), unless the authorization is terminated  or revoked sooner.        Influenza A by PCR NEGATIVE NEGATIVE Final   Influenza B by PCR NEGATIVE NEGATIVE Final    Comment: (NOTE) The Xpert Xpress SARS-CoV-2/FLU/RSV plus assay is intended as an aid in the diagnosis of influenza from Nasopharyngeal swab specimens and should not be used as a sole basis for treatment. Nasal washings and aspirates are unacceptable for Xpert Xpress SARS-CoV-2/FLU/RSV testing.  Fact Sheet for Patients: EntrepreneurPulse.com.au  Fact Sheet for Healthcare Providers: IncredibleEmployment.be  This test is not yet approved or cleared by the [place] FDA and has been authorized for detection and/or diagnosis of SARS-CoV-2 by FDA under an Emergency Use Authorization (EUA). This EUA will remain in effect (meaning this test can be used) for the duration of the COVID-19 declaration under Section 564(b)(1) of the Act, 21 U.S.C. section 360bbb-3(b)(1), unless the authorization is terminated or revoked.  Performed at [facility], [place] [address] [place], [place] [address]   Urine Culture     Status: Abnormal   Collection Time: 10/19/21 12:00 AM   Specimen: Urine, Random  Result Value Ref Range Status   Specimen Description URINE, RANDOM  Final   Special Requests   Final    NONE Performed at [facility], [place] [address] [place],  [address]    Culture >=100,000 COLONIES/mL ESCHERICHIA COLI (A)  Final   Report Status 10/21/2021 FINAL  Final   Organism ID, Bacteria ESCHERICHIA COLI (A)  Final      Susceptibility   Escherichia coli - MIC*    AMPICILLIN 8 SENSITIVE Sensitive     CEFAZOLIN <=4 SENSITIVE Sensitive     CEFEPIME <=0.12 SENSITIVE Sensitive     CEFTRIAXONE <=0.25 SENSITIVE Sensitive  CIPROFLOXACIN <=0.25 SENSITIVE Sensitive     GENTAMICIN <=1 SENSITIVE Sensitive     IMIPENEM <=0.25 SENSITIVE Sensitive     NITROFURANTOIN <=16 SENSITIVE Sensitive     TRIMETH/SULFA <=20 SENSITIVE Sensitive      AMPICILLIN/SULBACTAM <=2 SENSITIVE Sensitive     PIP/TAZO <=4 SENSITIVE Sensitive     * >=100,000 COLONIES/mL ESCHERICHIA COLI    Recommendation: Please assess for presence of urinary symptoms (dysuria, frequency, urgency) and if negative no further treatment is needed for asymptomatic bacteruria. If urinary symptoms present start keflex 250mg  by mouth every 6 hours for 5 days.   ED Provider: Regan Lemming, MD  Cristela Felt, PharmD, BCPS Clinical Pharmacist 10/22/2021 9:39 AM  Clinical Pharmacist Monday - Friday phone -  [phone] Saturday - Sunday phone - [phone]

## 2021-10-22 NOTE — Telephone Encounter (Signed)
Post ED Visit - Positive Culture Follow-up: Successful Patient Follow-Up  Culture assessed and recommendations reviewed by:  []  Elenor Quinones, Pharm.D. []  Heide Guile, Pharm.D., BCPS AQ-ID []  Parks Neptune, Pharm.D., BCPS []  Alycia Rossetti, Pharm.D., BCPS []  [place], [place].D., BCPS, AAHIVP []  Legrand Como, Pharm.D., BCPS, AAHIVP []  Salome Arnt, PharmD, BCPS []  Johnnette Gourd, PharmD, BCPS []  Hughes Better, PharmD, BCPS []  Leeroy Cha, PharmD  Positive urine culture  [x]  Patient discharged without antimicrobial prescription and symptom check now needed []  Organism is resistant to prescribed ED discharge antimicrobial []  Patient with positive blood cultures  Changes discussed with ED provider: Dr Regan Lemming  New antibiotic prescription if symptomatic,start keflex 250mg  po every 6 hours x 5 days Patient denies any urinary symptoms, no treatment needed  Contacted patient, date 10/22/2021, time [place] 10/22/2021, 11:35 AM

## 2021-11-22 ENCOUNTER — Other Ambulatory Visit: Payer: Self-pay

## 2021-11-22 ENCOUNTER — Encounter (HOSPITAL_BASED_OUTPATIENT_CLINIC_OR_DEPARTMENT_OTHER): Payer: Self-pay | Admitting: Emergency Medicine

## 2021-11-22 ENCOUNTER — Emergency Department (HOSPITAL_BASED_OUTPATIENT_CLINIC_OR_DEPARTMENT_OTHER): Payer: Medicare Other

## 2021-11-22 ENCOUNTER — Emergency Department (HOSPITAL_BASED_OUTPATIENT_CLINIC_OR_DEPARTMENT_OTHER)
Admission: EM | Admit: 2021-11-22 | Discharge: 2021-11-22 | Disposition: A | Discharge status: Home / Self Care | Payer: Medicare Other | Attending: Emergency Medicine | Admitting: Emergency Medicine

## 2021-11-22 DIAGNOSIS — E119 Type 2 diabetes mellitus without complications: Secondary | ICD-10-CM | POA: Diagnosis not present

## 2021-11-22 DIAGNOSIS — S01511A Laceration without foreign body of lip, initial encounter: Secondary | ICD-10-CM | POA: Diagnosis not present

## 2021-11-22 DIAGNOSIS — Z23 Encounter for immunization: Secondary | ICD-10-CM | POA: Insufficient documentation

## 2021-11-22 DIAGNOSIS — R519 Headache, unspecified: Secondary | ICD-10-CM | POA: Diagnosis not present

## 2021-11-22 DIAGNOSIS — Z794 Long term (current) use of insulin: Secondary | ICD-10-CM | POA: Diagnosis not present

## 2021-11-22 DIAGNOSIS — S0993XA Unspecified injury of face, initial encounter: Secondary | ICD-10-CM | POA: Diagnosis present

## 2021-11-22 DIAGNOSIS — R040 Epistaxis: Secondary | ICD-10-CM | POA: Diagnosis not present

## 2021-11-22 DIAGNOSIS — W010XXA Fall on same level from slipping, tripping and stumbling without subsequent striking against object, initial encounter: Secondary | ICD-10-CM | POA: Insufficient documentation

## 2021-11-22 DIAGNOSIS — W19XXXA Unspecified fall, initial encounter: Secondary | ICD-10-CM

## 2021-11-22 MED ORDER — TETANUS-DIPHTH-ACELL PERTUSSIS 5-2.5-18.5 LF-MCG/0.5 IM SUSY
0.5000 mL | PREFILLED_SYRINGE | Freq: Once | INTRAMUSCULAR | Status: AC
  Administered 2021-11-22: 0.5 mL via INTRAMUSCULAR
  Filled 2021-11-22: qty 0.5

## 2021-11-22 MED ORDER — OXYCODONE-ACETAMINOPHEN 5-325 MG PO TABS
1.0000 | ORAL_TABLET | Freq: Once | ORAL | Status: AC
  Administered 2021-11-22: 1 via ORAL
  Filled 2021-11-22: qty 1

## 2021-11-22 MED ORDER — LIDOCAINE HCL (PF) 1 % IJ SOLN
10.0000 mL | Freq: Once | INTRAMUSCULAR | Status: AC
  Administered 2021-11-22: 10 mL
  Filled 2021-11-22: qty 10

## 2021-11-22 NOTE — ED Provider Notes (Signed)
[facility] Provider Note   CSN: [id number] Arrival date & time: 11/22/21  1454     History Chief Complaint  Patient presents with   Facial Injury    Vicki Francis is a 50 y.o. female with a history of acute lymphoblastic leukemia and diabetes mellitus.  Presents emergency department with a chief complaint of lower lip laceration and facial trauma after suffering a fall.  Patient reports that fall occurred approximately 1 hour prior.  Patient states that she tripped over a loose piece of concrete.  Patient endorses hitting her face on the concrete.  Patient denies any loss of consciousness.  Patient complains of pain to laceration site on lip and above her upper lip.  Patient rates pain 5/10 on the pain scale.  No aggravating factors.  Patient has not tried any modalities to alleviate her symptoms.  Patient reports that she is on Plavix.  Denies any syncope, neck pain, back pain, numbness, weakness, saddle anesthesia, headache, visual disturbance, facial asymmetry, dental pain, trouble swallowing, bowel/bladder incontinence.  Patient is unsure when her last tetanus shot was   Facial Injury Associated symptoms: epistaxis   Associated symptoms: no headaches, no nausea, no neck pain and no vomiting       Past Medical History:  Diagnosis Date   ALL (acute lymphoblastic leukemia) ([employer])    Diabetes mellitus without complication ([place])    Renal disorder     There are no problems to display for this patient.   Past Surgical History:  Procedure Laterality Date   lithotripsy     TONSILLECTOMY       OB History   No obstetric history on file.     History reviewed. No pertinent family history.  Social History   Tobacco Use   Smoking status: Never  Substance Use Topics   Alcohol use: No    Home Medications Prior to Admission medications   Medication Sig Start Date End Date Taking? Authorizing Provider  cephALEXin (KEFLEX) 500 MG capsule  Take 1 capsule (500 mg total) by mouth 4 (four) times daily. 07/11/15   Hess, Hessie Diener, PA-C  ciprofloxacin (CIPRO) 500 MG tablet Take 1 tablet (500 mg total) by mouth 2 (two) times daily. One po bid x 7 days 05/20/15   Molpus, John, MD  HYDROcodone-acetaminophen (NORCO/VICODIN) 5-325 MG per tablet Take 1-2 tablets by mouth every 4 (four) hours as needed. 07/11/15   Hess, Hessie Diener, PA-C  Insulin Aspart (NOVOLOG [place]) Inject into the skin.    [provider]  levETIRAcetam (KEPPRA) 500 MG tablet Take 1 tablet (500 mg total) by mouth 2 (two) times daily. 10/18/21 12/02/21  Kolongowski, Beth, DO  metoCLOPramide (REGLAN) 10 MG tablet Take 1 tablet (10 mg total) by mouth every 6 (six) hours as needed for nausea or vomiting. 05/20/15   Molpus, Jenny Reichmann, MD  oxyCODONE-acetaminophen (PERCOCET) 5-325 MG per tablet Take 1-2 tablets by mouth every 6 (six) hours as needed (for pain). 05/20/15   Molpus, John, MD  tamsulosin (FLOMAX) 0.4 MG CAPS capsule Take 1 capsule daily until stone passes. 05/20/15   Molpus, John, MD    Allergies    Fentanyl, Aleve [naproxen sodium], and Codeine  Review of Systems   Review of Systems  Constitutional:  Negative for chills and fever.  HENT:  Positive for facial swelling and nosebleeds. Negative for dental problem and trouble swallowing.   Eyes:  Negative for visual disturbance.  Respiratory:  Negative for shortness of breath.   Cardiovascular:  Negative for chest pain.  Gastrointestinal:  Negative for abdominal pain, nausea and vomiting.  Genitourinary:  Negative for enuresis.  Musculoskeletal:  Negative for arthralgias, back pain, myalgias and neck pain.  Skin:  Positive for wound. Negative for color change and rash.  Neurological:  Negative for dizziness, tremors, seizures, syncope, facial asymmetry, speech difficulty, weakness, light-headedness, numbness and headaches.  Psychiatric/Behavioral:  Negative for confusion.    Physical Exam Updated Vital Signs BP 113/75 (BP  Location: Left Arm)    Pulse 95    Temp 97.6 F (36.4 C) (Oral)    Resp 20    Ht 5\' 1"  (1.549 m)    Wt 112 kg    SpO2 100%    BMI 46.67 kg/m   Physical Exam Vitals and nursing note reviewed.  Constitutional:      General: She is not in acute distress.    Appearance: She is not ill-appearing, toxic-appearing or diaphoretic.  HENT:     Head: Normocephalic. Contusion and laceration present. No raccoon eyes, Battle's sign, right periorbital erythema or left periorbital erythema.     Jaw: No trismus, tenderness, swelling, pain on movement or malocclusion.      Comments: Patient has swelling and ecchymosis below nose as noted above. 1.6cm laceration to lower lip, laceration crosses vermilion border. Superficial abrasion below the left nasolabial fold     Nose: Nasal deformity and nasal tenderness present.     Right Nostril: No septal hematoma.     Left Nostril: No septal hematoma.     Comments: Patient is notices deviated to left side.  Patient has tenderness to bridge of nose.  Dried blood noted within both nares and below both nares.    Mouth/Throat:     Mouth: Mucous membranes are moist. Lacerations present.     Dentition: Normal dentition.     Pharynx: Oropharynx is clear. Uvula midline. No pharyngeal swelling, oropharyngeal exudate, posterior oropharyngeal erythema or uvula swelling.     Comments: 1 cm laceration to mucous membranes above upper lip.  All hemorrhage controlled. Eyes:     General: No scleral icterus.       Right eye: No discharge.        Left eye: No discharge.     Extraocular Movements: Extraocular movements intact.     Conjunctiva/sclera: Conjunctivae normal.     Pupils: Pupils are equal, round, and reactive to light.  Cardiovascular:     Rate and Rhythm: Normal rate.  Pulmonary:     Effort: Pulmonary effort is normal.  Abdominal:     Palpations: Abdomen is soft.     Tenderness: There is no abdominal tenderness.  Musculoskeletal:     Cervical back: Normal range  of motion and neck supple. No swelling, edema, deformity, erythema, signs of trauma, lacerations, rigidity, spasms, torticollis, tenderness, bony tenderness or crepitus. No pain with movement, spinous process tenderness or muscular tenderness. Normal range of motion.     Thoracic back: No swelling, edema, deformity, signs of trauma, lacerations, spasms, tenderness or bony tenderness.     Lumbar back: No swelling, edema, deformity, signs of trauma, lacerations, spasms, tenderness or bony tenderness.     Comments: No midline tenderness deformity to cervical, thoracic, or lumbar spine.  No tenderness, bony tenderness, or deformity to bilateral upper and lower extremities.  Pelvis stable.  No leg length discrepancy.  Skin:    General: Skin is warm and dry.  Neurological:     General: No focal deficit present.  Mental Status: She is alert.     GCS: GCS eye subscore is 4. GCS verbal subscore is 5. GCS motor subscore is 6.     Cranial Nerves: No cranial nerve deficit or facial asymmetry.     Sensory: Sensation is intact.     Motor: No weakness, tremor, seizure activity or pronator drift.     Coordination: Finger-Nose-Finger Test normal.     Gait: Gait abnormal.     Comments: CN II-XII intact; performed in supine position, +5 strength to bilateral upper extremities, +5 strength to dorsiflexion and plantarflexion, patient able to lift both legs against gravity and hold each there without difficulty.  Station to light touch intact to bilateral upper and lower extremities  Psychiatric:        Behavior: Behavior is cooperative.      ED Results / Procedures / Treatments   Labs (all labs ordered are listed, but only abnormal results are displayed) Labs Reviewed - No data to display  EKG None  Radiology CT Head Wo Contrast  Result Date: 11/22/2021 CLINICAL DATA:  Head trauma, on blood thinners. Fall with lip laceration. EXAM: CT HEAD WITHOUT CONTRAST CT MAXILLOFACIAL WITHOUT CONTRAST TECHNIQUE:  Multidetector CT imaging of the head and maxillofacial structures were performed using the standard protocol without intravenous contrast. Multiplanar CT image reconstructions of the maxillofacial structures were also generated. COMPARISON:  Head CT, head and neck CTA, and head MRI 10/18/2021 FINDINGS: CT HEAD FINDINGS Brain: There is no evidence of an acute infarct, intracranial hemorrhage, midline shift, or extra-axial fluid collection. Mild cerebral and mild-to-moderate cerebellar atrophy are noted. Hypodensities in the cerebral white matter bilaterally are unchanged and nonspecific but compatible with moderate chronic small vessel ischemic disease. Extra-axial masses over the high right frontal convexity, left sphenoid wing, and right petrous ridge are unchanged. Vascular: Calcified atherosclerosis at the skull base. No hyperdense vessel. Skull: No calvarial fracture. Other: None. CT MAXILLOFACIAL FINDINGS Osseous: No acute fracture or mandibular dislocation. Orbits: Unremarkable. Sinuses: Small volume fluid/hematoma in the right maxillary sinus. Hyperdense material in the right nasal cavity compatible with hematoma. Clear mastoid air cells. Soft tissues: Right-sided lower lip laceration. Diffuse soft tissue swelling/hematoma involving the upper lip extending into the right nasal labial fold region and nasal soft tissues. IMPRESSION: 1. No evidence of acute intracranial abnormality. 2. Right-sided lower lip laceration. Hematoma and soft tissue swelling involving the upper lip and nose with hematoma in the right nasal cavity and right maxillary sinus. 3. No acute maxillofacial fracture. 4. Unchanged meningiomas. 5. Moderate chronic small vessel ischemic disease. Electronically Signed   By: Logan Bores M.D.   On: 11/22/2021 17:00   CT Maxillofacial Wo Contrast  Result Date: 11/22/2021 CLINICAL DATA:  Head trauma, on blood thinners. Fall with lip laceration. EXAM: CT HEAD WITHOUT CONTRAST CT MAXILLOFACIAL  WITHOUT CONTRAST TECHNIQUE: Multidetector CT imaging of the head and maxillofacial structures were performed using the standard protocol without intravenous contrast. Multiplanar CT image reconstructions of the maxillofacial structures were also generated. COMPARISON:  Head CT, head and neck CTA, and head MRI 10/18/2021 FINDINGS: CT HEAD FINDINGS Brain: There is no evidence of an acute infarct, intracranial hemorrhage, midline shift, or extra-axial fluid collection. Mild cerebral and mild-to-moderate cerebellar atrophy are noted. Hypodensities in the cerebral white matter bilaterally are unchanged and nonspecific but compatible with moderate chronic small vessel ischemic disease. Extra-axial masses over the high right frontal convexity, left sphenoid wing, and right petrous ridge are unchanged. Vascular: Calcified atherosclerosis at the skull base.  No hyperdense vessel. Skull: No calvarial fracture. Other: None. CT MAXILLOFACIAL FINDINGS Osseous: No acute fracture or mandibular dislocation. Orbits: Unremarkable. Sinuses: Small volume fluid/hematoma in the right maxillary sinus. Hyperdense material in the right nasal cavity compatible with hematoma. Clear mastoid air cells. Soft tissues: Right-sided lower lip laceration. Diffuse soft tissue swelling/hematoma involving the upper lip extending into the right nasal labial fold region and nasal soft tissues. IMPRESSION: 1. No evidence of acute intracranial abnormality. 2. Right-sided lower lip laceration. Hematoma and soft tissue swelling involving the upper lip and nose with hematoma in the right nasal cavity and right maxillary sinus. 3. No acute maxillofacial fracture. 4. Unchanged meningiomas. 5. Moderate chronic small vessel ischemic disease. Electronically Signed   By: Logan Bores M.D.   On: 11/22/2021 17:00    Procedures .Marland KitchenLaceration Repair  Date/Time: 11/22/2021 7:15 PM Performed by: Loni Beckwith, PA-C Authorized by: Loni Beckwith, PA-C    Consent:    Consent obtained:  Verbal   Consent given by:  Patient   Risks discussed:  Infection, need for additional repair, pain, poor cosmetic result, poor wound healing and retained foreign body   Alternatives discussed:  No treatment and delayed treatment Universal protocol:    Procedure explained and questions answered to patient or proxy's satisfaction: yes     Immediately prior to procedure, a time out was called: yes     Patient identity confirmed:  Verbally with patient Anesthesia:    Anesthesia method:  Nerve block   Block needle gauge:  25 G   Block anesthetic:  Lidocaine 1% w/o epi   Block injection procedure:  Anatomic landmarks identified, anatomic landmarks palpated, introduced needle, negative aspiration for blood and incremental injection   Block outcome:  Anesthesia achieved Laceration details:    Location:  Lip   Lip location:  Lower exterior lip   Length (cm):  1.6   Depth (mm):  3 Pre-procedure details:    Preparation:  Patient was prepped and draped in usual sterile fashion Exploration:    Wound exploration: entire depth of wound visualized     Wound extent: no foreign bodies/material noted   Treatment:    Area cleansed with:  Saline and povidone-iodine   Amount of cleaning:  Extensive   Irrigation solution:  Sterile saline Skin repair:    Repair method:  Sutures   Suture size:  6-0   Wound skin closure material used: Vicryl.   Suture technique:  Simple interrupted   Number of sutures:  9 Approximation:    Approximation:  Close   Vermilion border well-aligned: yes   Repair type:    Repair type:  Complex Post-procedure details:    Dressing:  Open (no dressing)   Procedure completion:  Tolerated well, no immediate complications   Medications Ordered in ED Medications  lidocaine (PF) (XYLOCAINE) 1 % injection 10 mL (10 mLs Infiltration Given by Other 11/22/21 1533)  Tdap (BOOSTRIX) injection 0.5 mL (0.5 mLs Intramuscular Given 11/22/21 1531)   oxyCODONE-acetaminophen (PERCOCET/ROXICET) 5-325 MG per tablet 1 tablet (1 tablet Oral Given 11/22/21 1530)    ED Course  I have reviewed the triage vital signs and the nursing notes.  Pertinent labs & imaging results that were available during my care of the patient were reviewed by me and considered in my medical decision making (see chart for details).    MDM Rules/Calculators/A&P  Alert 50 year old female no acute distress, nontoxic-appearing.  Presents emergency department with chief complaint of lip laceration and facial pain after suffering a mechanical fall.  Patient is on Plavix.  Patient denies any loss of consciousness.  Denies any neck pain, back pain, numbness, weakness, saddle anesthesia, bowel/bladder incontinence.  No midline tenderness to cervical, thoracic, or lumbar spine.  Patient has full range of motion to cervical neck.  Neuro exam is reassuring.  Low suspicion for spinal injury at this time.  Due to patient's fall on thinners will obtain noncontrast head CT scan.  Due to patient's facial trauma will obtain noncontrast maxillofacial CT.  Patient's tetanus shot updated.  Laceration repair as noted above.   Noncontrast head CT shows no evidence of acute intracranial abnormality. Noncontrast CT maxillofacial shows no acute maxillofacial fracture.  Right-sided lower lip laceration, hematoma and soft tissue swelling involving the upper lip and nose with hematoma in the right nasal cavity and right maxillary sinus.  No septal hematoma was visualized on exam.  Suspect that hematoma seen within patient's notices from blood clot due to patient's epistaxis.  Patient continues to have no focal neurologic deficits.  Will discharge patient at this time.  Patient given information on symptomatic treatment including over-the-counter pain medication and ice.  Patient given information to follow-up with plastic surgery for concerns over cosmetic outcome.  Patient to  follow-up with PCP as needed.  Patient given strict return precautions.  Discussed results, findings, treatment and follow up. Patient advised of return precautions. Patient verbalized understanding and agreed with plan.  Patient care discussed with attending physician Dr.Curatolo      Final Clinical Impression(s) / ED Diagnoses Final diagnoses:  Fall, initial encounter  Lip laceration, initial encounter  Facial pain    Rx / DC Orders ED Discharge Orders     None        Loni Beckwith, PA-C 11/22/21 1921    Lennice Sites, DO 11/22/21 2007

## 2021-11-22 NOTE — Discharge Instructions (Addendum)
You came to the emergency department today to be evaluated for your injuries after suffering a fall.  The CT scan of your head showed no intracranial bleeds.  The CT scan of your face showed no broken bones.  The laceration to your lip required repair sutures, these will absorb over time.  Please read attached paperwork on laceration care and mouth laceration.  et help right away if: You have: Loss of feeling (numbness), tingling, or weakness in your arms or legs. Very bad neck pain, especially tenderness in the middle of the back of your neck. A change in your ability to control your pee or poop (stool). More pain in any area of your body. Swelling in any area of your body, especially your legs. Shortness of breath or light-headedness. Chest pain. Blood in your pee, poop, or vomit. Very bad pain in your belly (abdomen) or your back. Very bad headaches or headaches that are getting worse. Sudden vision loss or double vision. Your eye suddenly turns red. The black center of your eye (pupil) is an odd shape or size. You develop severe swelling around the wound. Your pain suddenly increases and is severe. You develop painful lumps near the wound or on skin anywhere else on your body. You have a red streak going away from your wound. The wound is on your hand or foot, and you cannot p

## 2021-11-22 NOTE — ED Notes (Signed)
D/c paperwork reviewed with pt, including f/u care. All questions addressed prior to d/c. Pt assisted to transfer to wheelchair, wheeled to ED exit by NT.

## 2021-11-22 NOTE — ED Notes (Signed)
Pt transported to CT ?

## 2021-11-22 NOTE — ED Triage Notes (Addendum)
Right lower lip laceration from mechanical fall that occurred ~1 hr PTA. Bleeding controlled at this time. Pt is on a blood thinner. Denies loc, hitting head, vision changes.

## 2022-01-17 ENCOUNTER — Other Ambulatory Visit: Payer: Self-pay

## 2022-01-17 ENCOUNTER — Emergency Department (HOSPITAL_BASED_OUTPATIENT_CLINIC_OR_DEPARTMENT_OTHER)
Admission: EM | Admit: 2022-01-17 | Discharge: 2022-01-17 | Disposition: A | Discharge status: Home / Self Care | Payer: Medicare Other | Attending: Emergency Medicine | Admitting: Emergency Medicine

## 2022-01-17 ENCOUNTER — Encounter (HOSPITAL_BASED_OUTPATIENT_CLINIC_OR_DEPARTMENT_OTHER): Payer: Self-pay

## 2022-01-17 ENCOUNTER — Emergency Department (HOSPITAL_BASED_OUTPATIENT_CLINIC_OR_DEPARTMENT_OTHER): Payer: Medicare Other

## 2022-01-17 DIAGNOSIS — R531 Weakness: Secondary | ICD-10-CM | POA: Diagnosis not present

## 2022-01-17 DIAGNOSIS — M79605 Pain in left leg: Secondary | ICD-10-CM | POA: Diagnosis present

## 2022-01-17 DIAGNOSIS — E119 Type 2 diabetes mellitus without complications: Secondary | ICD-10-CM | POA: Diagnosis not present

## 2022-01-17 DIAGNOSIS — Z794 Long term (current) use of insulin: Secondary | ICD-10-CM | POA: Insufficient documentation

## 2022-01-17 DIAGNOSIS — Z7902 Long term (current) use of antithrombotics/antiplatelets: Secondary | ICD-10-CM | POA: Insufficient documentation

## 2022-01-17 DIAGNOSIS — M5136 Other intervertebral disc degeneration, lumbar region: Secondary | ICD-10-CM | POA: Insufficient documentation

## 2022-01-17 DIAGNOSIS — Z86011 Personal history of benign neoplasm of the brain: Secondary | ICD-10-CM | POA: Insufficient documentation

## 2022-01-17 MED ORDER — PREDNISONE 10 MG (21) PO TBPK
ORAL_TABLET | Freq: Every day | ORAL | 0 refills | Status: AC
Start: 2022-01-17 — End: ?

## 2022-01-17 MED ORDER — MELOXICAM 15 MG PO TABS: 15.0000 mg | ORAL_TABLET | Freq: Every day | ORAL | 0 refills | Status: AC

## 2022-01-17 MED ORDER — KETOROLAC TROMETHAMINE 60 MG/2ML IM SOLN
60.0000 mg | Freq: Once | INTRAMUSCULAR | Status: AC
  Administered 2022-01-17: 60 mg via INTRAMUSCULAR
  Filled 2022-01-17: qty 2

## 2022-01-17 NOTE — Discharge Instructions (Signed)
You were seen today for left leg pain. Your exam was concerning for radicular nerve pain. I recommend follow up with an orthopedic provider for further evaluation and management. I've provided a steroid taper pack which should be taken according to the instructions provided by the pharmacy. I've also provided a prescription for meloxicam to help with inflammation. Return to the emergency department if you develop life-threatening conditions such as shortness of breath, changes in neurologic status, or chest pain

## 2022-01-17 NOTE — ED Triage Notes (Signed)
Pt c/o pain to left LE and buttock x 2 weeks-denies injury-to triage in w/c

## 2022-01-17 NOTE — ED Provider Notes (Signed)
[facility] EMERGENCY DEPARTMENT Provider Note   CSN: [id number] Arrival date & time: 01/17/22  1637     History  Chief Complaint  Patient presents with   Leg Pain    Vicki Francis is a 51 y.o. female.  The patient complains of left sided leg pain that runs from the buttock down to the calf along the lateral leg which has been ongoing for 2 weeks.  She describes the pain as sharp and shooting.  He denies any recent injury.  She denies issues with back pain.  She has past medical history significant for a stroke in 2019 currently on Plavix, chronic left leg weakness, type 2 diabetes, insulin, meningioma.  HPI     Home Medications Prior to Admission medications   Medication Sig Start Date End Date Taking? Authorizing Provider  cephALEXin (KEFLEX) 500 MG capsule Take 1 capsule (500 mg total) by mouth 4 (four) times daily. 07/11/15   Hess, Hessie Diener, PA-C  ciprofloxacin (CIPRO) 500 MG tablet Take 1 tablet (500 mg total) by mouth 2 (two) times daily. One po bid x 7 days 05/20/15   Molpus, John, MD  HYDROcodone-acetaminophen (NORCO/VICODIN) 5-325 MG per tablet Take 1-2 tablets by mouth every 4 (four) hours as needed. 07/11/15   Hess, Hessie Diener, PA-C  Insulin Aspart (NOVOLOG [place]) Inject into the skin.    [provider]  levETIRAcetam (KEPPRA) 500 MG tablet Take 1 tablet (500 mg total) by mouth 2 (two) times daily. 10/18/21 12/02/21  Kolongowski, Beth, DO  metoCLOPramide (REGLAN) 10 MG tablet Take 1 tablet (10 mg total) by mouth every 6 (six) hours as needed for nausea or vomiting. 05/20/15   Molpus, Jenny Reichmann, MD  oxyCODONE-acetaminophen (PERCOCET) 5-325 MG per tablet Take 1-2 tablets by mouth every 6 (six) hours as needed (for pain). 05/20/15   Molpus, John, MD  tamsulosin (FLOMAX) 0.4 MG CAPS capsule Take 1 capsule daily until stone passes. 05/20/15   Molpus, John, MD      Allergies    Fentanyl, Aleve [naproxen sodium], and Codeine    Review of Systems   Review of Systems   Constitutional:  Negative for fatigue.  Respiratory:  Negative for shortness of breath.   Cardiovascular:  Negative for chest pain.  Gastrointestinal:  Negative for abdominal pain.  Musculoskeletal:  Positive for gait problem.       Pain shooting down lateral left leg from buttock to calf  Neurological:  Positive for weakness (Chronic left leg weakness).   Physical Exam Updated Vital Signs BP (!) 115/91 (BP Location: Left Arm)    Pulse 86    Temp 98 F (36.7 C) (Oral)    Resp 18    Ht 5\' 1"  (1.549 m)    Wt 112.9 kg    SpO2 100%    BMI 47.05 kg/m  Physical Exam HENT:     Head: Normocephalic.  Eyes:     Conjunctiva/sclera: Conjunctivae normal.  Cardiovascular:     Rate and Rhythm: Normal rate and regular rhythm.     Pulses: Normal pulses.     Heart sounds: Normal heart sounds.  Pulmonary:     Effort: Pulmonary effort is normal. No respiratory distress.     Breath sounds: Normal breath sounds.  Musculoskeletal:     Cervical back: Normal range of motion.  Neurological:     Mental Status: She is alert.     Motor: Weakness (Left leg weakness, similar to previous encounters) present.    ED Results / Procedures /  Treatments   Labs (all labs ordered are listed, but only abnormal results are displayed) Labs Reviewed - No data to display  EKG None  Radiology DG Lumbar Spine Complete  Result Date: 01/17/2022 CLINICAL DATA:  Left lower extremity radicular pain. EXAM: LUMBAR SPINE - COMPLETE 4+ VIEW COMPARISON:  CT abdomen and pelvis 09/15/2020 FINDINGS: There are 5 non rib-bearing lumbar vertebrae. Vertebral alignment is normal. No acute fracture is identified. 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 Very mild degenerative endplate spurring is present throughout the lumbar spine. There is mild disc space narrowing at L4-5, and there is mild lower lumbar facet arthrosis. IMPRESSION: No evidence of acute osseous abnormality. Mild lumbar disc and facet  degeneration. Electronically Signed   By: Logan Bores M.D.   On: 01/17/2022 20:37    Procedures Procedures    Medications Ordered in ED Medications  ketorolac (TORADOL) injection 60 mg (has no administration in time range)    ED Course/ Medical Decision Making/ A&P                           Medical Decision Making Amount and/or Complexity of Data Reviewed Radiology: ordered.  Risk Prescription drug management.   This patient presents to the ED for concern of left leg pain, this involves an extensive number of treatment options, and is a complaint that carries with it a high risk of complications and morbidity.  The differential diagnosis includes but is not exclusive to radicular nerve pain, fibromyalgia, lumbar radiculopathy, and others   Co morbidities that complicate the patient evaluation  History of CVA and left leg weakness   Additional history obtained:  External records from outside source obtained and reviewed including emergency room notes from 10/18/2021   Lab Tests:  None   Imaging Studies ordered:  I ordered imaging studies including plain films of the lumbar spine I independently visualized and interpreted imaging which showed narrowing between L4 and L5 I agree with the radiologist interpretation   Cardiac Monitoring:  None   Medicines ordered and prescription drug management:   I have reviewed the patients home medicines and have made adjustments as needed I ordered a Toradol injection for pain.  The patient's pain was improved after the injection   Test Considered:  MRI lumbar spine   Critical Interventions:  None   Consultations Obtained:  None    Reevaluation:  After the interventions noted above, I reevaluated the patient and found that they have :improved     Dispostion:  After consideration of the diagnostic results and the patients response to treatment, I feel that the patent would benefit from home with  outpatient orthopedic follow-up.  The patient has no new neurologic deficits.  There is some minimal disc base narrowing between L4 and L5 on plain radiographs which could potentially lead to radicular nerve pain.  The patient's pain improved after a Toradol injection.  I see no reason to admit the patient to the hospital at this time.  I believe the patient may discharge home and follow-up with orthopedics outpatient.  I provided a prescription for a steroid Dosepak along with meloxicam to help reduce inflammation.  Return precautions provided.   Final Clinical Impression(s) / ED Diagnoses Final diagnoses:  Left leg pain    Rx / DC Orders ED Discharge Orders          Ordered    predniSONE (STERAPRED UNI-PAK 21  TAB) 10 MG (21) TBPK tablet  Daily        Pending    meloxicam (MOBIC) 15 MG tablet  Daily        Pending              Dorothyann Peng, [place] 01/17/22 2139    Lucrezia Starch, MD 01/20/22 [phone]

## 2022-04-13 ENCOUNTER — Other Ambulatory Visit: Payer: Self-pay

## 2022-04-13 ENCOUNTER — Emergency Department (HOSPITAL_BASED_OUTPATIENT_CLINIC_OR_DEPARTMENT_OTHER)
Admission: EM | Admit: 2022-04-13 | Discharge: 2022-04-13 | Disposition: A | Discharge status: Home / Self Care | Payer: Medicare Other | Attending: Emergency Medicine | Admitting: Emergency Medicine

## 2022-04-13 ENCOUNTER — Encounter (HOSPITAL_BASED_OUTPATIENT_CLINIC_OR_DEPARTMENT_OTHER): Payer: Self-pay | Admitting: Emergency Medicine

## 2022-04-13 DIAGNOSIS — G8929 Other chronic pain: Secondary | ICD-10-CM

## 2022-04-13 DIAGNOSIS — M25562 Pain in left knee: Secondary | ICD-10-CM

## 2022-04-13 DIAGNOSIS — M93262 Osteochondritis dissecans, left knee: Secondary | ICD-10-CM

## 2022-04-13 DIAGNOSIS — M25572 Pain in left ankle and joints of left foot: Secondary | ICD-10-CM

## 2022-04-13 MED ORDER — TRAMADOL HCL 50 MG PO TABS: 50.0000 mg | ORAL_TABLET | Freq: Four times a day (QID) | ORAL | 0 refills | Status: AC | PRN

## 2022-04-13 MED ORDER — TRAMADOL HCL 50 MG PO TABS
50.0000 mg | ORAL_TABLET | Freq: Once | ORAL | Status: AC
  Administered 2022-04-13: 50 mg via ORAL
  Filled 2022-04-13: qty 1

## 2022-04-13 MED ORDER — ACETAMINOPHEN 500 MG PO TABS
1000.0000 mg | ORAL_TABLET | Freq: Once | ORAL | Status: AC
  Administered 2022-04-13: 1000 mg via ORAL
  Filled 2022-04-13: qty 2

## 2022-04-13 NOTE — ED Provider Notes (Signed)
?[facility] EMERGENCY DEPARTMENT ?Provider Note ? ? ?CSN: [id number] ?Arrival date & time: 04/13/22  0547 ? ?  ? ?History ? ?Chief Complaint  ?Patient presents with  ? Ankle Pain  ? Knee Pain  ? ? ?Vicki Francis is a 51 y.o. female. ? ?Patient c/o pain to left knee and ankle in past several months. States noted increased pain in past couple days. Pain constant, dull, at rest. Denies specific new injury or strain. Notes in [facility] indicates prior imaging  10/2021, and ortho eval then notes 'osteochondritis dessicans'/degenerative changes. No fever or chills. No radicular pain. No hip pain. No leg swelling or redness. No numbness/weakness.  ? ?The history is provided by the patient and medical records.  ?Ankle Pain ?Associated symptoms: no back pain and no fever   ?Knee Pain ?Associated symptoms: no back pain and no fever   ? ?  ? ?Home Medications ?Prior to Admission medications   ?Medication Sig Start Date End Date Taking? Authorizing Provider  ?cephALEXin (KEFLEX) 500 MG capsule Take 1 capsule (500 mg total) by mouth 4 (four) times daily. 07/11/15   Hess, Hessie Diener, PA-C  ?ciprofloxacin (CIPRO) 500 MG tablet Take 1 tablet (500 mg total) by mouth 2 (two) times daily. One po bid x 7 days 05/20/15   Molpus, John, MD  ?HYDROcodone-acetaminophen (NORCO/VICODIN) 5-325 MG per tablet Take 1-2 tablets by mouth every 4 (four) hours as needed. 07/11/15   Hess, Hessie Diener, PA-C  ?Insulin Aspart (NOVOLOG [place]) Inject into the skin.    [provider]  ?levETIRAcetam (KEPPRA) 500 MG tablet Take 1 tablet (500 mg total) by mouth 2 (two) times daily. 10/18/21 12/02/21  Kolongowski, Beth, DO  ?meloxicam (MOBIC) 15 MG tablet Take 1 tablet (15 mg total) by mouth daily. 01/17/22   Dorothyann Peng, PA-C  ?metoCLOPramide (REGLAN) 10 MG tablet Take 1 tablet (10 mg total) by mouth every 6 (six) hours as needed for nausea or vomiting. 05/20/15   Molpus, Jenny Reichmann, MD  ?oxyCODONE-acetaminophen (PERCOCET) 5-325 MG per tablet  Take 1-2 tablets by mouth every 6 (six) hours as needed (for pain). 05/20/15   Molpus, John, MD  ?predniSONE (STERAPRED UNI-PAK 21 TAB) 10 MG (21) TBPK tablet Take by mouth daily. Take 6 tabs by mouth daily  for 2 days, then 5 tabs for 2 days, then 4 tabs for 2 days, then 3 tabs for 2 days, 2 tabs for 2 days, then 1 tab by mouth daily for 2 days 01/17/22   Cherlynn June B, PA-C  ?tamsulosin (FLOMAX) 0.4 MG CAPS capsule Take 1 capsule daily until stone passes. 05/20/15   Molpus, Jenny Reichmann, MD  ?   ? ?Allergies    ?Fentanyl, Aleve [naproxen sodium], and Codeine   ? ?Review of Systems   ?Review of Systems  ?Constitutional:  Negative for fever.  ?Cardiovascular:  Negative for leg swelling.  ?Musculoskeletal:  Negative for back pain.  ?Skin:  Negative for rash and wound.  ?Neurological:  Negative for weakness and numbness.  ? ?Physical Exam ?Updated Vital Signs ?BP 132/79   Pulse 73   Temp 97.6 ?F (36.4 ?C) (Oral)   Resp 16   Ht 1.549 m ('5\' 1"'$ )   Wt 116.6 kg   SpO2 99%   BMI 48.56 kg/m?  ?Physical Exam ?Vitals and nursing note reviewed.  ?Constitutional:   ?   Appearance: Normal appearance. She is well-developed.  ?HENT:  ?   Head: Atraumatic.  ?Neck:  ?   Trachea: No  tracheal deviation.  ?Cardiovascular:  ?   Rate and Rhythm: Normal rate.  ?   Pulses: Normal pulses.  ?Pulmonary:  ?   Effort: Pulmonary effort is normal. No respiratory distress.  ?Genitourinary: ?   Comments: No cva tenderness.  ?Musculoskeletal:     ?   General: No swelling or tenderness.  ?   Cervical back: Neck supple. No muscular tenderness.  ?   Right lower leg: No edema.  ?   Left lower leg: No edema.  ?   Comments: Good passive rom left hip, knee and ankle without pain, distal pulses palp. No LLE edema. No knee or ankle effusion noted. No skin changes, erythema or infection noted.   ?Skin: ?   General: Skin is warm and dry.  ?   Findings: No rash.  ?Neurological:  ?   Mental Status: She is alert.  ?   Comments: Alert, speech normal. LLE nvi,  motor/sens grossly intact.   ?Psychiatric:     ?   Mood and Affect: Mood normal.  ? ? ?ED Results / Procedures / Treatments   ?Labs ?(all labs ordered are listed, but only abnormal results are displayed) ?Labs Reviewed - No data to display ? ?EKG ?None ? ?Radiology ?No results found. ? ?Procedures ?Procedures  ? ? ?Medications Ordered in ED ?Medications  ?acetaminophen (TYLENOL) tablet 1,000 mg (has no administration in time range)  ?traMADol (ULTRAM) tablet 50 mg (has no administration in time range)  ? ? ?ED Course/ Medical Decision Making/ A&P ?  ?                        ?Medical Decision Making ?Problems Addressed: ?Acute left ankle pain: acute illness or injury ?Acute pain of left knee: acute illness or injury ?Chronic pain of left ankle: chronic illness or injury ?Chronic pain of left knee: chronic illness or injury ?Osteochondritis dissecans, left knee: chronic illness or injury ? ?Amount and/or Complexity of Data Reviewed ?External Data Reviewed: radiology and notes. ? ?Risk ?OTC drugs. ?Prescription drug management. ? ? ?No meds today or prior to arrival. Uses voltaren cream.  ? ?Acetaminophen po, ultram po.  ? ?Reviewed nursing notes and prior charts for additional history. Prior charts/imaging/ortho notes reviewed.  ? ?Pt indicates currently in PT for same.  ? ?Notes indicate plan that if pain persist, subsequent ortho f/u, and outpatient mri  - pt encouraged to f/u with ortho. ? ?Rx for home.  ? ? ? ? ? ? ? ?Final Clinical Impression(s) / ED Diagnoses ?Final diagnoses:  ?None  ? ? ?Rx / DC Orders ?ED Discharge Orders   ? ? None  ? ?  ? ? ?  ?Lajean Saver, MD ?04/13/22 0730 ? ?

## 2022-04-13 NOTE — ED Triage Notes (Signed)
Pt c/o left ankle and left knee pain. Pt denies any injury. Pt is s/p CVA with left sided deficits ?

## 2022-04-13 NOTE — Discharge Instructions (Addendum)
It was our pleasure to provide your ER care today - we hope that you feel better. ? ?Take acetaminophen or ibuprofen as need (if taking ibuprofen or nsaid, take with food to help minimize any GI side effects).  You may also take ultram as need for pain - no driving when taking.  ? ?Follow up with your doctor/orthopedist in the next couple weeks - discuss arranging follow up MRI/imaging.  ? ?Return to ER if worse, new symptoms, fevers, intractable pain, numbness/weakness, increased redness/swelling, or other concern.  ?

## 2022-04-13 NOTE — ED Notes (Signed)
Pharmacy updated with patient 

## 2023-10-28 IMAGING — DX DG LUMBAR SPINE COMPLETE 4+V
5 series · 5 of 5 positions shown · non-contrast
Comparison: CT abdomen and pelvis 09/15/2020

CLINICAL DATA: Left lower extremity radicular pain.

EXAM:
LUMBAR SPINE - COMPLETE 4+ VIEW

[l-spine ap]
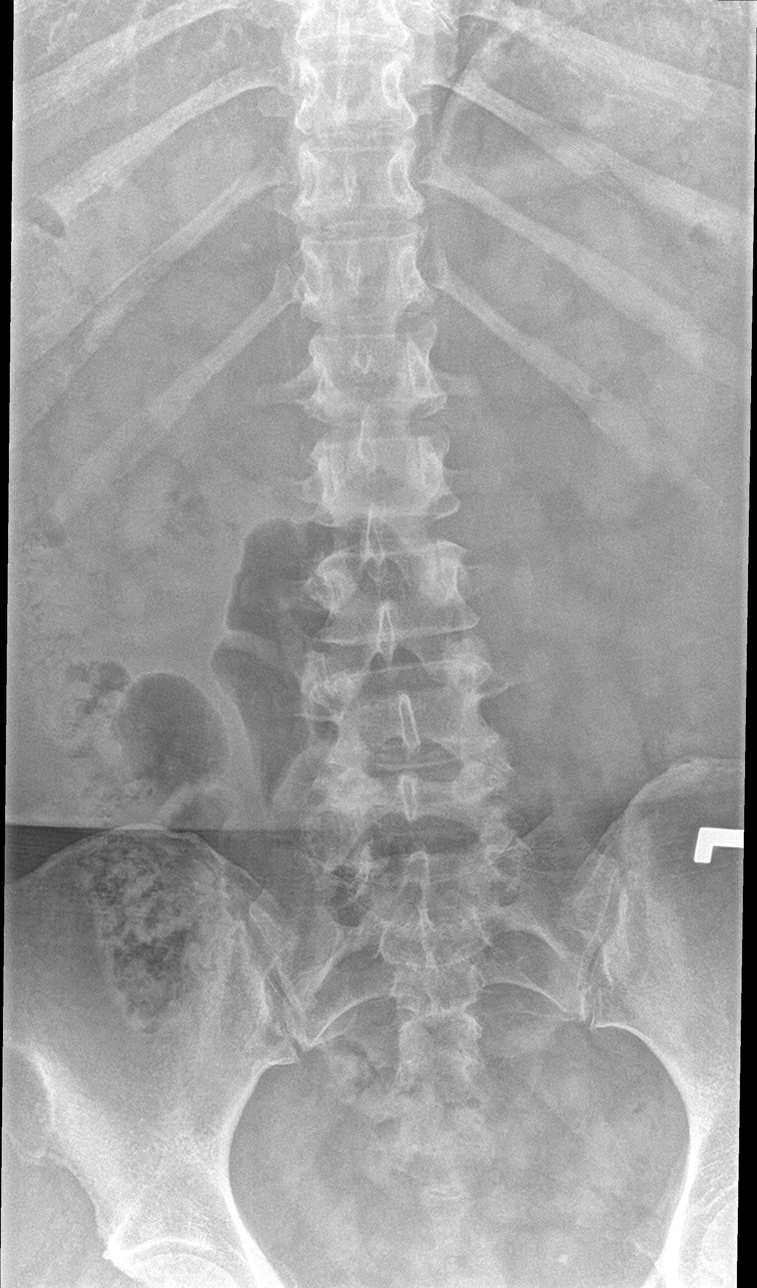

[l-spine obl (1 of 2)]
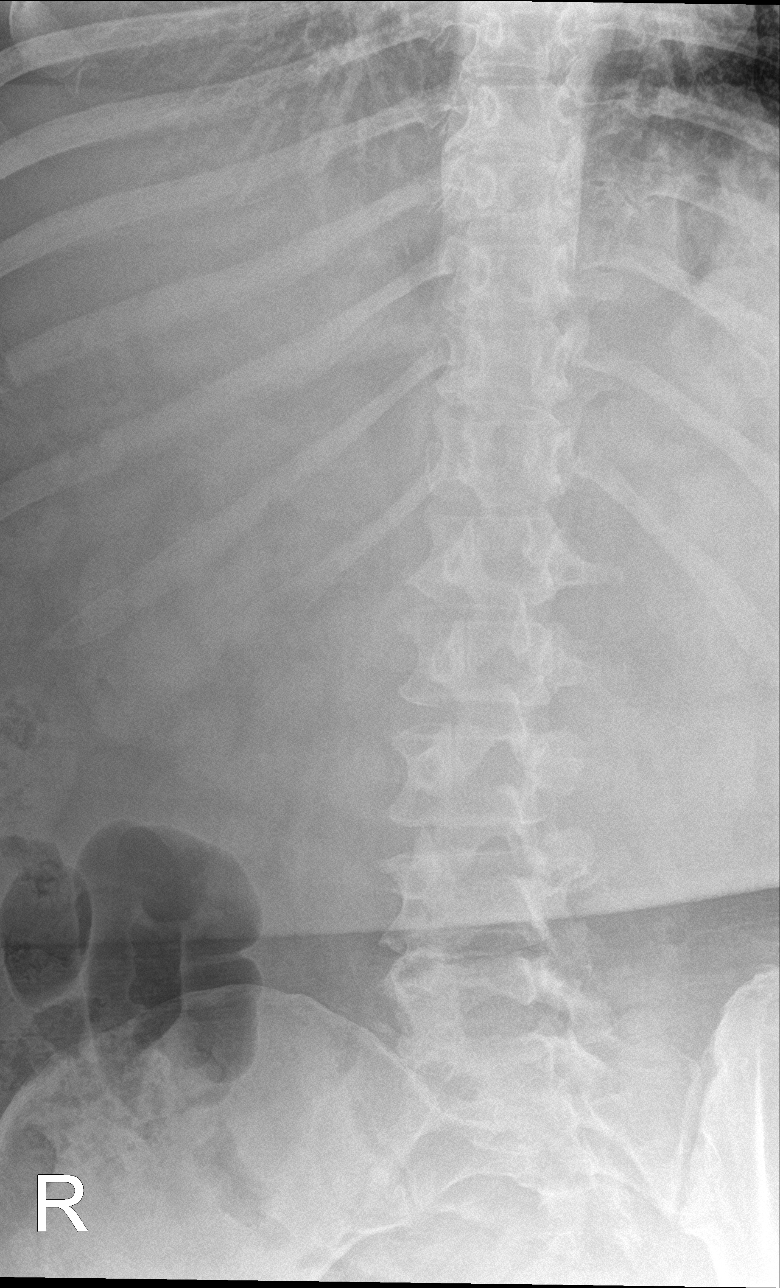

[l-spine obl (2 of 2)]
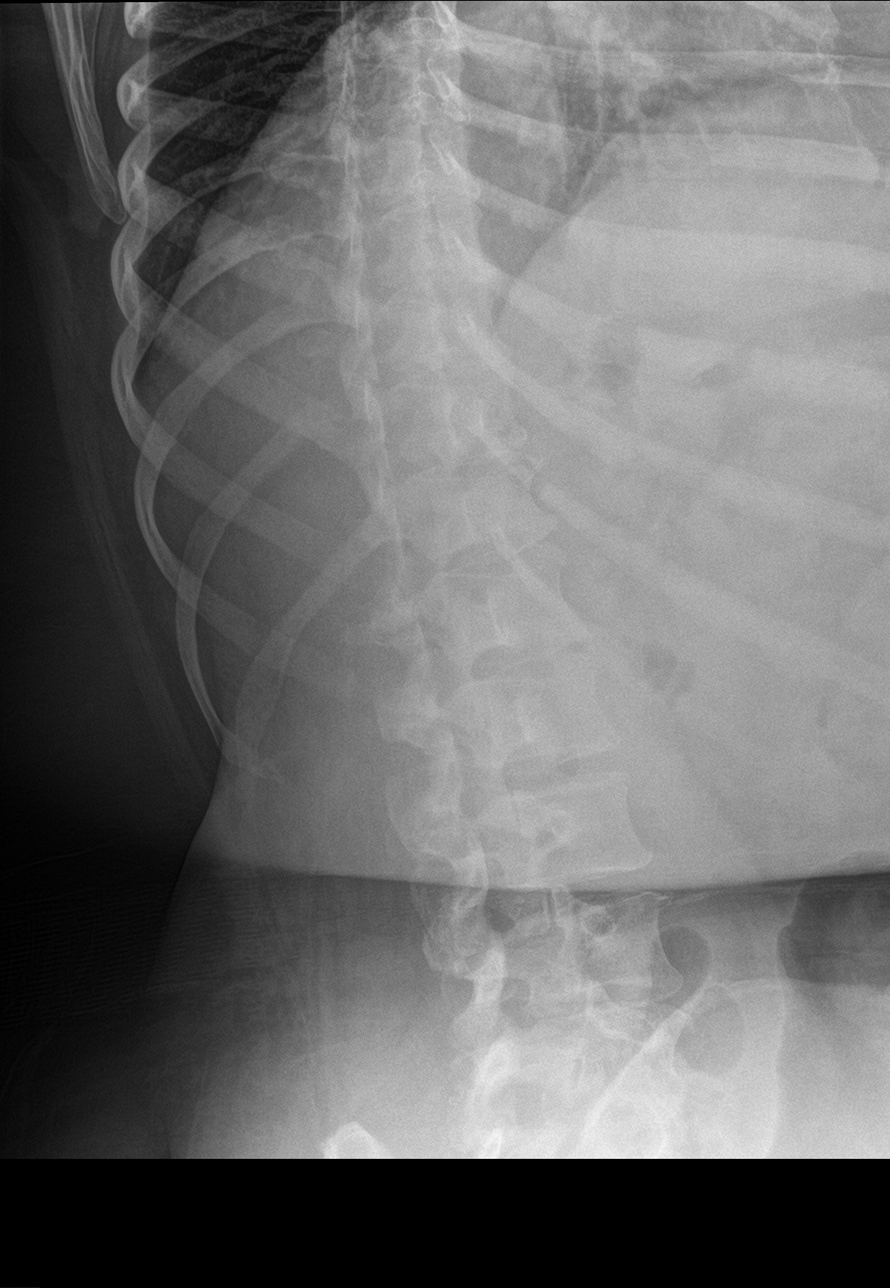

[l-spine lat]
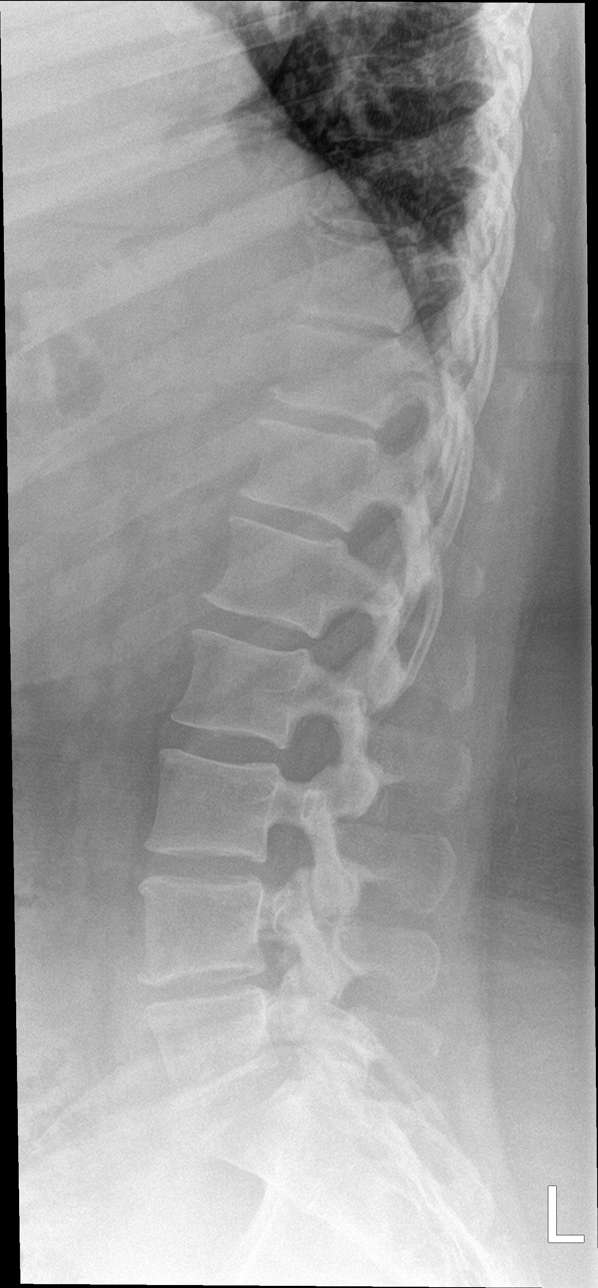

[l-spine spot]
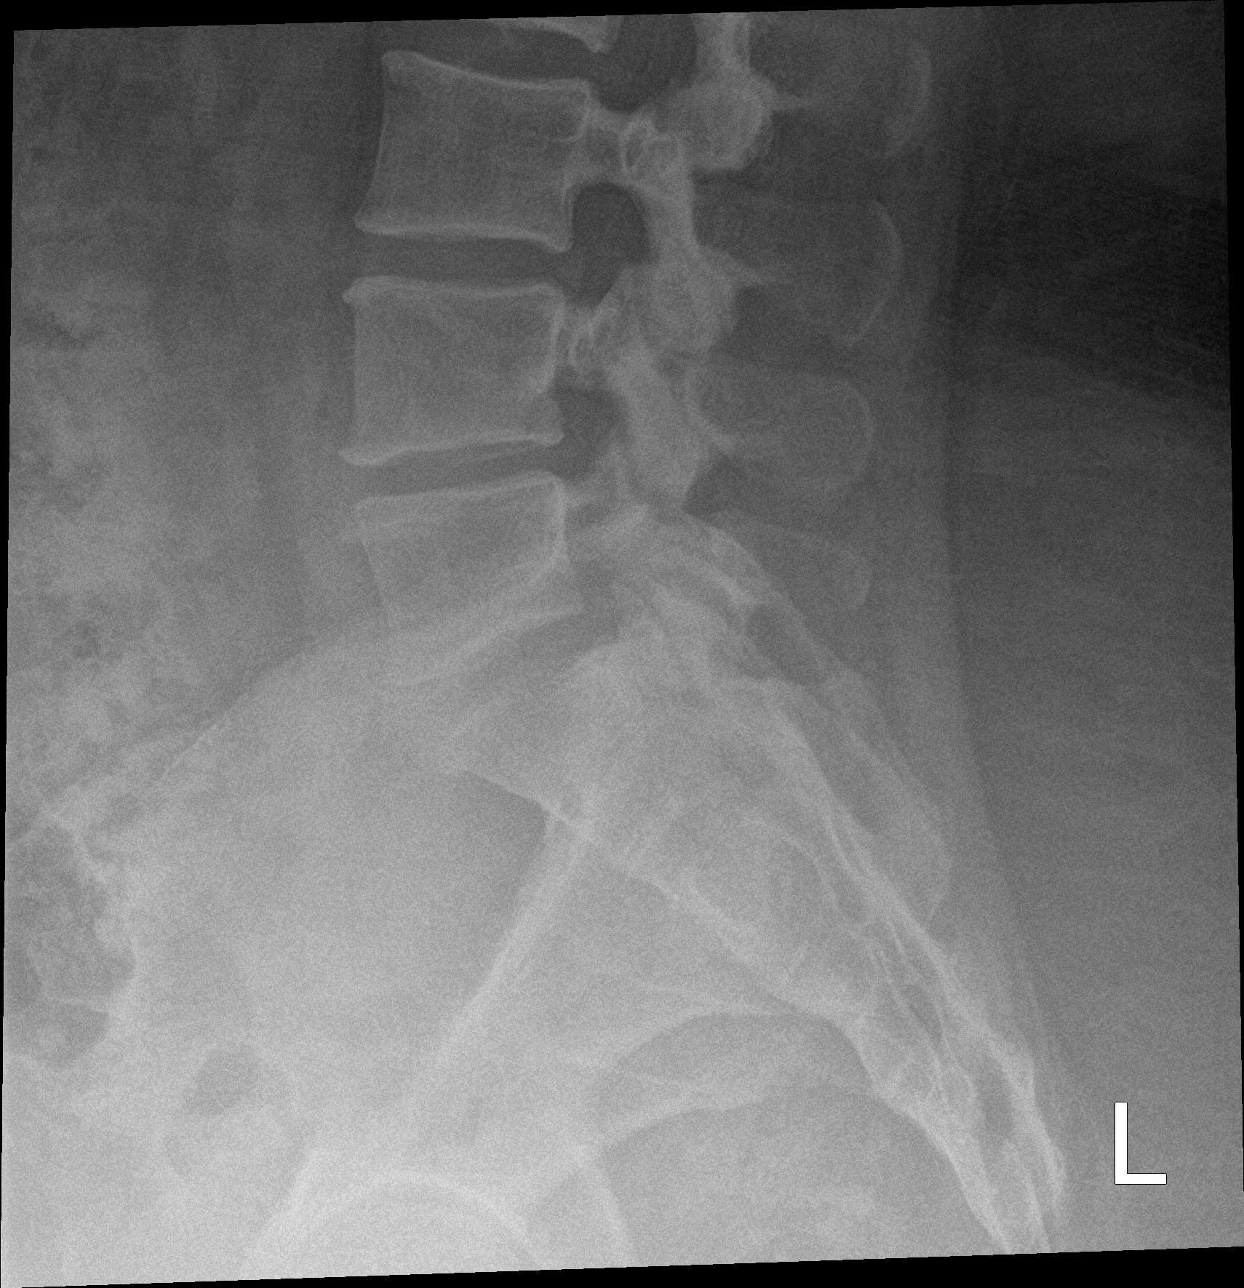

[5 of 5 positions shown; findings below may reference images not displayed]

FINDINGS: There are 5 non rib-bearing lumbar vertebrae. Vertebral alignment is
normal. No acute fracture is identified. There is a chronic left L5
pars defect which was shown on the prior CT. Very mild degenerative
endplate spurring is present throughout the lumbar spine. There is
mild disc space narrowing at L4-5, and there is mild lower lumbar
facet arthrosis.
IMPRESSION: No evidence of acute osseous abnormality. Mild lumbar disc and facet
degeneration.
